# Patient Record
Sex: Female | Born: 1965
Health system: Southern US, Community
[De-identification: ages and names within clinical notes are randomized; demographics above are authoritative.]

## PROBLEM LIST (undated history)

## (undated) DIAGNOSIS — R112 Nausea with vomiting, unspecified: Secondary | ICD-10-CM

## (undated) DIAGNOSIS — M1990 Unspecified osteoarthritis, unspecified site: Secondary | ICD-10-CM

## (undated) DIAGNOSIS — K219 Gastro-esophageal reflux disease without esophagitis: Secondary | ICD-10-CM

## (undated) DIAGNOSIS — Z9889 Other specified postprocedural states: Secondary | ICD-10-CM

## (undated) DIAGNOSIS — N281 Cyst of kidney, acquired: Secondary | ICD-10-CM

## (undated) HISTORY — PX: ABDOMINAL HYSTERECTOMY: SHX81

## (undated) HISTORY — PX: BREAST SURGERY: SHX581

## (undated) HISTORY — DX: Cyst of kidney, acquired: N28.1

## (undated) HISTORY — PX: SPINAL FUSION: SHX223

---

## 1997-03-18 LAB — HM PAP SMEAR: HM Pap smear: NORMAL

## 2004-04-10 LAB — HM MAMMOGRAPHY: HM Mammogram: NORMAL

## 2006-10-13 ENCOUNTER — Emergency Department: Payer: Self-pay | Admitting: Internal Medicine

## 2009-10-08 HISTORY — PX: SPINE SURGERY: SHX786

## 2009-11-15 ENCOUNTER — Emergency Department: Payer: Self-pay | Admitting: Emergency Medicine

## 2009-12-27 ENCOUNTER — Ambulatory Visit: Payer: Self-pay | Admitting: Neurology

## 2010-01-12 ENCOUNTER — Ambulatory Visit: Payer: Self-pay | Admitting: Unknown Physician Specialty

## 2010-01-13 ENCOUNTER — Ambulatory Visit: Payer: Self-pay | Admitting: Unknown Physician Specialty

## 2010-01-17 ENCOUNTER — Inpatient Hospital Stay: Payer: Self-pay | Admitting: Unknown Physician Specialty

## 2010-01-26 ENCOUNTER — Encounter: Payer: Self-pay | Admitting: Unknown Physician Specialty

## 2010-02-05 ENCOUNTER — Encounter: Payer: Self-pay | Admitting: Unknown Physician Specialty

## 2010-03-08 ENCOUNTER — Encounter: Payer: Self-pay | Admitting: Unknown Physician Specialty

## 2011-07-06 ENCOUNTER — Ambulatory Visit: Payer: Self-pay | Admitting: General Practice

## 2011-07-26 ENCOUNTER — Encounter: Payer: Self-pay | Admitting: Internal Medicine

## 2011-07-26 ENCOUNTER — Ambulatory Visit (INDEPENDENT_AMBULATORY_CARE_PROVIDER_SITE_OTHER): Payer: PRIVATE HEALTH INSURANCE | Admitting: Internal Medicine

## 2011-07-26 DIAGNOSIS — Z0000 Encounter for general adult medical examination without abnormal findings: Secondary | ICD-10-CM

## 2011-07-26 DIAGNOSIS — R110 Nausea: Secondary | ICD-10-CM

## 2011-07-26 DIAGNOSIS — R109 Unspecified abdominal pain: Secondary | ICD-10-CM

## 2011-07-26 MED ORDER — OMEPRAZOLE 40 MG PO CPDR: 40.0000 mg | DELAYED_RELEASE_CAPSULE | Freq: Every day | ORAL | Status: DC

## 2011-07-26 MED ORDER — ONDANSETRON HCL 4 MG PO TABS: 4.0000 mg | ORAL_TABLET | Freq: Three times a day (TID) | ORAL | Status: AC | PRN

## 2011-07-26 NOTE — Progress Notes (Signed)
Subjective:    Patient ID: Heather Pope, female    DOB: 1966-04-12, 45 y.o.   MRN: [id number]  HPI Heather Pope is a 45 year old female who presents to establish care. Her primary concern today is abdominal pain. She reports that she first developed abdominal pain approximately one month ago. The pain is described as aching in her mid upper abdomen. It is worse at night. It is associated with a sensation of bloating or distention. She reports that she almost constantly feels nauseous. She denies any vomiting. She denies any change in her nausea with food intake. She denies any diarrhea. She denies any change in her bowel movements or blood in her stool. She was evaluated by a practitioner at [facility] and had a right upper quadrant ultrasound which was normal. She has been taking Zantac with minimal improvement. She denies any fever or chills. She denies any other symptoms.  Outpatient Encounter Prescriptions as of 07/26/2011  Medication Sig Dispense Refill  . acetaminophen (TYLENOL) 500 MG tablet Take 500 mg by mouth every 6 (six) hours as needed. 2 tablets three times daily       . ranitidine (ZANTAC) 150 MG capsule Take 150 mg by mouth 2 (two) times daily. 3 tablets daily        . omeprazole (PRILOSEC) 40 MG capsule Take 1 capsule (40 mg total) by mouth daily.  30 capsule  1  . ondansetron (ZOFRAN) 4 MG tablet Take 1 tablet (4 mg total) by mouth every 8 (eight) hours as needed for nausea.  20 tablet  0    Review of Systems  Constitutional: Negative for fever, chills, appetite change, fatigue and unexpected weight change.  HENT: Negative for ear pain, congestion, sore throat, trouble swallowing, neck pain, voice change and sinus pressure.   Eyes: Negative for visual disturbance.  Respiratory: Negative for cough, shortness of breath, wheezing and stridor.   Cardiovascular: Negative for chest pain, palpitations and leg swelling.  Gastrointestinal: Positive for nausea,  abdominal pain and abdominal distention. Negative for vomiting, diarrhea, constipation, blood in stool and anal bleeding.  Genitourinary: Negative for dysuria, urgency, frequency, hematuria, flank pain, vaginal bleeding, vaginal discharge, difficulty urinating and vaginal pain.  Musculoskeletal: Negative for myalgias, arthralgias and gait problem.  Skin: Negative for color change and rash.  Neurological: Negative for dizziness and headaches.  Hematological: Negative for adenopathy. Does not bruise/bleed easily.  Psychiatric/Behavioral: Negative for suicidal ideas, sleep disturbance and dysphoric mood. The patient is not nervous/anxious.    BP 121/85  Pulse 67  Temp(Src) 98.6 F (37 C) (Oral)  Resp 16  Ht 5\' 8"  (1.727 m)  Wt 175 lb (79.379 kg)  BMI 26.61 kg/m2  SpO2 97%     Objective:   Physical Exam  Constitutional: She is oriented to person, place, and time. She appears well-developed and well-nourished. No distress.  HENT:  Head: Normocephalic and atraumatic.  Right Ear: External ear normal.  Left Ear: External ear normal.  Nose: Nose normal.  Mouth/Throat: Oropharynx is clear and moist. No oropharyngeal exudate.  Eyes: Conjunctivae are normal. Pupils are equal, round, and reactive to light. Right eye exhibits no discharge. Left eye exhibits no discharge. No scleral icterus.  Neck: Normal range of motion. Neck supple. No tracheal deviation present. No thyromegaly present.  Cardiovascular: Normal rate, regular rhythm, normal heart sounds and intact distal pulses.  Exam reveals no gallop and no friction rub.   No murmur heard. Pulmonary/Chest: Effort normal and breath sounds normal. No respiratory  distress. She has no wheezes. She has no rales. She exhibits no tenderness.  Abdominal: Soft. Bowel sounds are normal. She exhibits no distension and no mass. There is no hepatosplenomegaly. There is tenderness in the right upper quadrant and epigastric area. There is no rebound, no  guarding and negative Murphy's sign. No hernia.  Musculoskeletal: Normal range of motion. She exhibits no edema and no tenderness.  Lymphadenopathy:    She has no cervical adenopathy.  Neurological: She is alert and oriented to person, place, and time. No cranial nerve deficit. She exhibits normal muscle tone. Coordination normal.  Skin: Skin is warm and dry. No rash noted. She is not diaphoretic. No erythema. No pallor.  Psychiatric: She has a normal mood and affect. Her behavior is normal. Judgment and thought content normal.          Assessment & Plan:  1. Abdominal pain - patient with epigastric pain. Right upper quadrant ultrasound which was performed was reviewed today. It showed normal-appearing gallbladder with no stones or sludging. It showed normal kidneys, spleen, liver. Her exam and symptoms are most concerning for gastritis or gastric ulcer. She has also had some recent anorexia and persistent nausea which is concerning for pancreatic abnormality. The right upper quadrant ultrasound which was performed did not completely visualize the pancreas. We will plan to get a CT of the abdomen and pelvis. We'll also send lab work including CMP and testing for H. pylori. We will start her on Prilosec. She will followup next week.

## 2011-07-27 ENCOUNTER — Ambulatory Visit: Payer: Self-pay | Admitting: Internal Medicine

## 2011-07-27 LAB — COMPREHENSIVE METABOLIC PANEL
ALT: 12 U/L (ref 0–35)
AST: 20 U/L (ref 0–37)
CO2: 27 mEq/L (ref 19–32)
GFR: 96.89 mL/min (ref >60.00)
Sodium: 138 mEq/L (ref 135–145)
Total Bilirubin: 0.5 mg/dL (ref 0.3–1.2)
Total Protein: 7.4 g/dL (ref 6.0–8.3)

## 2011-07-27 LAB — HELICOBACTER PYLORI  ANTIBODY, IGM: Helicobacter pylori, IgM: 2 U/mL (ref <9.0)

## 2011-07-30 ENCOUNTER — Telehealth: Payer: Self-pay | Admitting: Internal Medicine

## 2011-07-30 NOTE — Telephone Encounter (Signed)
OK. I would like to set her up with Dr. Skeet Simmer asap.  She needs endoscopy.  Can you set this up?

## 2011-07-30 NOTE — Telephone Encounter (Signed)
Informed patient that her abdominal CT was normal.  She states she is still hurting in her abdominal area and she is still nauseated.

## 2011-07-30 NOTE — Telephone Encounter (Signed)
Pt calling to get lab and ct results done last thur//friday am Please call [phone]

## 2011-07-30 NOTE — Telephone Encounter (Signed)
Message copied by Virgina Evener on Mon Jul 30, 2011  2:01 PM ------      Message from: Ronna Polio A      Created: Fri Jul 27, 2011  5:40 PM      Regarding: CT abd       CT abd was normal. Are symptoms better?

## 2011-07-31 ENCOUNTER — Telehealth: Payer: Self-pay | Admitting: *Deleted

## 2011-07-31 NOTE — Telephone Encounter (Signed)
Message copied by Jobie Quaker on Tue Jul 31, 2011  4:40 PM ------      Message from: Ronna Polio A      Created: Fri Jul 27, 2011  5:40 PM      Regarding: CT abd       CT abd was normal. Are symptoms better?

## 2011-08-01 NOTE — Telephone Encounter (Signed)
Left message advising patient that CT was normal And asked that she call office back if symptoms had not improved.

## 2011-08-02 ENCOUNTER — Ambulatory Visit: Payer: Self-pay | Admitting: Internal Medicine

## 2011-08-09 ENCOUNTER — Encounter: Payer: Self-pay | Admitting: Internal Medicine

## 2011-08-09 ENCOUNTER — Ambulatory Visit (INDEPENDENT_AMBULATORY_CARE_PROVIDER_SITE_OTHER): Payer: PRIVATE HEALTH INSURANCE | Admitting: Internal Medicine

## 2011-08-09 ENCOUNTER — Ambulatory Visit: Payer: Self-pay | Admitting: Internal Medicine

## 2011-08-09 DIAGNOSIS — R109 Unspecified abdominal pain: Secondary | ICD-10-CM | POA: Insufficient documentation

## 2011-08-09 DIAGNOSIS — R928 Other abnormal and inconclusive findings on diagnostic imaging of breast: Secondary | ICD-10-CM

## 2011-08-09 NOTE — Progress Notes (Signed)
Subjective:    Patient ID: Heather Pope, female    DOB: 1966/07/07, 45 y.o.   MRN: [id number]  HPI 45 year old female with a history of abdominal pain presents for followup. In the interim since her last visit, she underwent CT of the abdomen which was normal. She also underwent laboratory testing including testing for H. pylori which was negative. She was then evaluated by GI medicine and asked to switch from Prilosec to Protonix. She has not yet made this change. She reports persistent abdominal pain. She also notes persistent nausea and decreased appetite. She reports that her abdominal pain is most prominent in the epigastric area and right upper quadrant. She notes minimal improvement with the use of Prilosec. She has not had any change in her bowel or bladder habits. She denies any blood in her stool or black stool. She denies any vomiting. She notes that she is scheduled for upper endoscopy next week.  She also notes a recent history of an abnormal mammogram. She has been scheduled for additional views of her right breast. She denies any palpable abnormalities in this area or leakage from her breast or change in the overlying skin color or texture.  Outpatient Encounter Prescriptions as of 08/09/2011  Medication Sig Dispense Refill  . acetaminophen (TYLENOL) 500 MG tablet Take 500 mg by mouth every 6 (six) hours as needed. 2 tablets three times daily       . omeprazole (PRILOSEC) 40 MG capsule Take 1 capsule (40 mg total) by mouth daily.  30 capsule  1  . ranitidine (ZANTAC) 150 MG capsule Take 150 mg by mouth 2 (two) times daily. 3 tablets daily          Review of Systems  Constitutional: Negative for fever, chills, appetite change, fatigue and unexpected weight change.  HENT: Negative for congestion, sore throat, trouble swallowing, voice change and sinus pressure.   Eyes: Negative for visual disturbance.  Respiratory: Negative for cough, shortness of breath, wheezing and stridor.     Cardiovascular: Negative for chest pain, palpitations and leg swelling.  Gastrointestinal: Positive for nausea, abdominal pain and abdominal distention. Negative for vomiting, diarrhea, constipation, blood in stool and anal bleeding.  Genitourinary: Negative for dysuria and flank pain.  Musculoskeletal: Negative for myalgias, arthralgias and gait problem.  Skin: Negative for color change and rash.  Neurological: Negative for dizziness and headaches.  Hematological: Negative for adenopathy. Does not bruise/bleed easily.  Psychiatric/Behavioral: Negative for suicidal ideas, sleep disturbance and dysphoric mood. The patient is not nervous/anxious.    BP 121/83  Pulse 66  Temp(Src) 98.2 F (36.8 C) (Oral)  Resp 16  Ht 5\' 8"  (1.727 m)  Wt 177 lb (80.287 kg)  BMI 26.91 kg/m2  SpO2 99%     Objective:   Physical Exam  Constitutional: She is oriented to person, place, and time. She appears well-developed and well-nourished. No distress.  HENT:  Head: Normocephalic and atraumatic.  Right Ear: External ear normal.  Left Ear: External ear normal.  Nose: Nose normal.  Mouth/Throat: Oropharynx is clear and moist.  Eyes: Conjunctivae are normal. Pupils are equal, round, and reactive to light. Right eye exhibits no discharge. Left eye exhibits no discharge. No scleral icterus.  Neck: Normal range of motion. Neck supple. No tracheal deviation present. No thyromegaly present.  Cardiovascular: Normal rate, regular rhythm, normal heart sounds and intact distal pulses.  Exam reveals no gallop and no friction rub.   No murmur heard. Pulmonary/Chest: Effort normal and breath sounds normal.  No respiratory distress. She has no wheezes. She has no rales. She exhibits no tenderness.  Abdominal: Soft. Bowel sounds are normal. She exhibits no distension. There is tenderness (right upper quadrant).  Musculoskeletal: Normal range of motion. She exhibits no edema and no tenderness.  Lymphadenopathy:    She  has no cervical adenopathy.  Neurological: She is alert and oriented to person, place, and time. No cranial nerve deficit. She exhibits normal muscle tone. Coordination normal.  Skin: Skin is warm and dry. No rash noted. She is not diaphoretic. No erythema. No pallor.  Psychiatric: She has a normal mood and affect. Her behavior is normal. Judgment and thought content normal.          Assessment & Plan:  1. Right upper quadrant abdominal pain - CT scan was unremarkable. Symptoms seem most consistent with gastritis or ulcer. However, she has had no improvement with the use of proton pump inhibitors. Upper endoscopy is pending. We discussed the potential use of sucralfate to help with pain, however she would prefer to wait until after her endoscopy is complete. She will followup in one month.  2. Abnormal mammogram -patient is scheduled next week for additional views of her right breast. We will followup with her after the study is complete.

## 2011-08-10 ENCOUNTER — Telehealth: Payer: Self-pay | Admitting: *Deleted

## 2011-08-10 NOTE — Telephone Encounter (Signed)
This was the correct patient for the normal chest xray. Patient has been notified.

## 2011-08-15 ENCOUNTER — Ambulatory Visit: Payer: Self-pay | Admitting: Internal Medicine

## 2011-08-15 ENCOUNTER — Encounter: Payer: Self-pay | Admitting: Internal Medicine

## 2011-08-17 ENCOUNTER — Telehealth: Payer: Self-pay | Admitting: Internal Medicine

## 2011-08-17 NOTE — Telephone Encounter (Signed)
Additional views on mammogram were normal. Follow up 1 year.

## 2011-08-17 NOTE — Telephone Encounter (Signed)
Patient informed. 

## 2011-08-17 NOTE — Telephone Encounter (Signed)
Left mess to call office back.   

## 2011-08-27 ENCOUNTER — Encounter: Payer: Self-pay | Admitting: Internal Medicine

## 2011-08-29 ENCOUNTER — Encounter: Payer: Self-pay | Admitting: Internal Medicine

## 2011-09-05 ENCOUNTER — Encounter: Payer: Self-pay | Admitting: Internal Medicine

## 2012-01-03 ENCOUNTER — Encounter: Payer: Self-pay | Admitting: Internal Medicine

## 2012-01-03 ENCOUNTER — Ambulatory Visit (INDEPENDENT_AMBULATORY_CARE_PROVIDER_SITE_OTHER): Payer: PRIVATE HEALTH INSURANCE | Admitting: Internal Medicine

## 2012-01-03 VITALS — BP 118/78 | HR 95 | Temp 98.4°F | Wt 192.0 lb

## 2012-01-03 DIAGNOSIS — M6580 Other synovitis and tenosynovitis, unspecified site: Secondary | ICD-10-CM

## 2012-01-03 DIAGNOSIS — M76892 Other specified enthesopathies of left lower limb, excluding foot: Secondary | ICD-10-CM | POA: Insufficient documentation

## 2012-01-03 MED ORDER — MELOXICAM 15 MG PO TABS: 15.0000 mg | ORAL_TABLET | Freq: Every day | ORAL | Status: AC

## 2012-01-03 MED ORDER — TRAMADOL-ACETAMINOPHEN 37.5-325 MG PO TABS: 1.0000 | ORAL_TABLET | Freq: Four times a day (QID) | ORAL | Status: AC | PRN

## 2012-01-03 NOTE — Progress Notes (Signed)
Subjective:    Patient ID: Heather Pope, female    DOB: 1965-11-14, 46 y.o.   MRN: [id number]  HPI 46 year old female presents for an acute visit after developing pain and swelling over both of her knees approximately 2 days ago. She notes that she was treated recently for pneumonia with a seven-day course of Levaquin. On approximately day 5 of 7 days she noticed tingling and pain over her knees and ankles. On day 6 she noticed some swelling over her knees. She feels as if her left knee will give out on her. She notes significant pain in the back of her left knee. She finished Levaquin today. She has been using ibuprofen and Tylenol at home for pain with minimal improvement. The pain is most severe in her left knee but is present in both of her knees and minimally in her ankles. She has not had issues with tendinitis in the past. She does not have any known injury to her legs.  Outpatient Encounter Prescriptions as of 01/03/2012  Medication Sig Dispense Refill  . acetaminophen (TYLENOL) 500 MG tablet Take 500 mg by mouth every 6 (six) hours as needed. 2 tablets three times daily       . meloxicam (MOBIC) 15 MG tablet Take 1 tablet (15 mg total) by mouth daily.  30 tablet  0  . traMADol-acetaminophen (ULTRACET) 37.5-325 MG per tablet Take 1 tablet by mouth every 6 (six) hours as needed for pain.  30 tablet  0    Review of Systems  Constitutional: Negative for fever, chills and fatigue.  Respiratory: Positive for cough.   Cardiovascular: Positive for leg swelling. Negative for chest pain.  Musculoskeletal: Positive for myalgias, joint swelling, arthralgias and gait problem.   BP 118/78  Pulse 95  Temp(Src) 98.4 F (36.9 C) (Oral)  Wt 192 lb (87.091 kg)  SpO2 96%     Objective:   Physical Exam  Constitutional: She is oriented to person, place, and time. She appears well-developed and well-nourished. No distress.  HENT:  Head: Normocephalic and atraumatic.  Right Ear: External ear  normal.  Left Ear: External ear normal.  Nose: Nose normal.  Mouth/Throat: Oropharynx is clear and moist. No oropharyngeal exudate.  Eyes: Conjunctivae are normal. Pupils are equal, round, and reactive to light. Right eye exhibits no discharge. Left eye exhibits no discharge. No scleral icterus.  Neck: Normal range of motion. Neck supple. No tracheal deviation present. No thyromegaly present.  Cardiovascular: Normal rate, regular rhythm, normal heart sounds and intact distal pulses.  Exam reveals no gallop and no friction rub.   No murmur heard. Pulmonary/Chest: Effort normal and breath sounds normal. No respiratory distress. She has no wheezes. She has no rales. She exhibits no tenderness.  Musculoskeletal: She exhibits no edema and no tenderness.       Right knee: She exhibits decreased range of motion. She exhibits no swelling, no effusion and no erythema.       Left knee: She exhibits decreased range of motion and swelling. She exhibits no effusion, no ecchymosis, no erythema, normal alignment and normal patellar mobility. tenderness found.  Lymphadenopathy:    She has no cervical adenopathy.  Neurological: She is alert and oriented to person, place, and time. No cranial nerve deficit. She exhibits normal muscle tone. Coordination normal.  Skin: Skin is warm and dry. No rash noted. She is not diaphoretic. No erythema. No pallor.  Psychiatric: She has a normal mood and affect. Her behavior is normal. Judgment and thought  content normal.          Assessment & Plan:

## 2012-01-03 NOTE — Assessment & Plan Note (Signed)
Likely secondary to use of Levaquin. Will stop Levaquin.  Will use meloxicam for inflammation. Pt will apply ice to both knees. She will refrain from any strenuous activity. She will wear bilateral soft knee braces. If pain worsens, or swelling progresses, she will follow up this weekend at our urgent clinic or in ED.  Otherwise, she will return for recheck Monday. If no improvement, would favor MRI left knee.

## 2012-01-07 ENCOUNTER — Ambulatory Visit (INDEPENDENT_AMBULATORY_CARE_PROVIDER_SITE_OTHER): Payer: PRIVATE HEALTH INSURANCE | Admitting: Internal Medicine

## 2012-01-07 ENCOUNTER — Encounter: Payer: Self-pay | Admitting: Internal Medicine

## 2012-01-07 VITALS — BP 102/68 | HR 95 | Temp 97.7°F | Ht 68.0 in | Wt 191.0 lb

## 2012-01-07 DIAGNOSIS — Z0279 Encounter for issue of other medical certificate: Secondary | ICD-10-CM

## 2012-01-07 DIAGNOSIS — M25569 Pain in unspecified knee: Secondary | ICD-10-CM

## 2012-01-07 DIAGNOSIS — M25562 Pain in left knee: Secondary | ICD-10-CM

## 2012-01-07 NOTE — Assessment & Plan Note (Addendum)
Symptoms concerning for meniscal tear. Will get MRI knee and set up evaluation with orthopedics.

## 2012-01-07 NOTE — Progress Notes (Signed)
  Subjective:    Patient ID: Heather Pope, female    DOB: 1966/05/07, 46 y.o.   MRN: [id number]  HPI 46 year old female with recent history of severe left knee pain x 1 week after being treated for pneumonia with Levaquin. She has stopped Levaquin. Pain in her other joints including right knee and ankles has improved. However, she continues to have severe pain in her left knee some instability in the knee. She also notes some popping sounds when she was walking this weekend. She has been using a brace with minimal improvement. She has been taking nonsteroidals and tramadol with minimal improvement in pain.  Outpatient Encounter Prescriptions as of 01/07/2012  Medication Sig Dispense Refill  . acetaminophen (TYLENOL) 500 MG tablet Take 500 mg by mouth every 6 (six) hours as needed. 2 tablets three times daily       . meloxicam (MOBIC) 15 MG tablet Take 1 tablet (15 mg total) by mouth daily.  30 tablet  0  . traMADol-acetaminophen (ULTRACET) 37.5-325 MG per tablet Take 1 tablet by mouth every 6 (six) hours as needed for pain.  30 tablet  0    Review of Systems  Constitutional: Negative for fever, chills and fatigue.  Respiratory: Negative for cough and shortness of breath.   Musculoskeletal: Positive for myalgias, joint swelling, arthralgias and gait problem.       Objective:   Physical Exam  Constitutional: She is oriented to person, place, and time. She appears well-developed and well-nourished. No distress.  HENT:  Head: Normocephalic and atraumatic.  Right Ear: External ear normal.  Left Ear: External ear normal.  Nose: Nose normal.  Mouth/Throat: Oropharynx is clear and moist.  Eyes: Conjunctivae are normal. Pupils are equal, round, and reactive to light. Right eye exhibits no discharge. Left eye exhibits no discharge. No scleral icterus.  Neck: Normal range of motion. Neck supple. No tracheal deviation present. No thyromegaly present.  Pulmonary/Chest: Effort normal. She exhibits  no tenderness.  Musculoskeletal: She exhibits no edema and no tenderness.       Left knee: She exhibits swelling. She exhibits normal range of motion. tenderness found.  Lymphadenopathy:    She has no cervical adenopathy.  Neurological: She is alert and oriented to person, place, and time. No cranial nerve deficit. She exhibits normal muscle tone. Coordination normal.  Skin: Skin is warm and dry. No rash noted. She is not diaphoretic. No erythema. No pallor.  Psychiatric: She has a normal mood and affect. Her behavior is normal. Judgment and thought content normal.          Assessment & Plan:

## 2012-01-08 ENCOUNTER — Ambulatory Visit: Payer: Self-pay | Admitting: Internal Medicine

## 2012-01-09 ENCOUNTER — Telehealth: Payer: Self-pay | Admitting: Internal Medicine

## 2012-01-09 NOTE — Telephone Encounter (Signed)
MRI left knee shows possible lateral meniscal tear and osteoarthritis.  Please confirm that pt has follow up with Ortho (I think scheduled for Friday).  She should stay off knee until eval by ortho. We should fax a copy to Dr. Martha Clan.

## 2012-01-10 ENCOUNTER — Encounter: Payer: Self-pay | Admitting: Internal Medicine

## 2012-01-10 NOTE — Telephone Encounter (Signed)
Patient informed, She will keep apt Friday. Have you seen her FMLA paperwork?

## 2012-01-10 NOTE — Telephone Encounter (Signed)
I filled out her paperwork already. Should be on your desk

## 2012-01-11 NOTE — Telephone Encounter (Signed)
I have gone thru the paperwork and faxes and have not seen this, Carollee Herter - can you help?

## 2012-01-15 ENCOUNTER — Encounter: Payer: Self-pay | Admitting: Internal Medicine

## 2012-01-15 NOTE — Telephone Encounter (Signed)
I called patient to see if she needed a copy of the [facility] paperwork and also advised her that it had been faxed to [employer] on 01/08/12.  She states that she doesn't need a copy of the form.

## 2012-01-15 NOTE — Telephone Encounter (Signed)
Maralyn Sago I have not seen any FMLA paperwork on patient.  I did however go look on your desk and in the folder marked Batch scanning found the patient's FMLA paperwork. It is marked Faxed on 01/08/12. I have made copy to send to billing to bill for the form being filled out and prepared the original to send to Batch [employer].

## 2012-01-24 ENCOUNTER — Ambulatory Visit: Payer: Self-pay | Admitting: Orthopedic Surgery

## 2012-01-28 ENCOUNTER — Encounter: Payer: Self-pay | Admitting: Orthopedic Surgery

## 2012-02-06 ENCOUNTER — Encounter: Payer: Self-pay | Admitting: Orthopedic Surgery

## 2012-05-28 ENCOUNTER — Encounter: Payer: Self-pay | Admitting: Internal Medicine

## 2012-05-28 ENCOUNTER — Ambulatory Visit (INDEPENDENT_AMBULATORY_CARE_PROVIDER_SITE_OTHER): Payer: PRIVATE HEALTH INSURANCE | Admitting: Internal Medicine

## 2012-05-28 VITALS — BP 100/70 | HR 78 | Temp 98.8°F | Ht 68.0 in | Wt 193.2 lb

## 2012-05-28 DIAGNOSIS — S83289A Other tear of lateral meniscus, current injury, unspecified knee, initial encounter: Secondary | ICD-10-CM

## 2012-05-28 DIAGNOSIS — M549 Dorsalgia, unspecified: Secondary | ICD-10-CM

## 2012-05-28 DIAGNOSIS — R5383 Other fatigue: Secondary | ICD-10-CM | POA: Insufficient documentation

## 2012-05-28 DIAGNOSIS — R5381 Other malaise: Secondary | ICD-10-CM

## 2012-05-28 DIAGNOSIS — R519 Headache, unspecified: Secondary | ICD-10-CM | POA: Insufficient documentation

## 2012-05-28 DIAGNOSIS — M545 Low back pain, unspecified: Secondary | ICD-10-CM | POA: Insufficient documentation

## 2012-05-28 DIAGNOSIS — R51 Headache: Secondary | ICD-10-CM | POA: Insufficient documentation

## 2012-05-28 MED ORDER — CYCLOBENZAPRINE HCL 5 MG PO TABS: 5.0000 mg | ORAL_TABLET | Freq: Three times a day (TID) | ORAL | Status: AC | PRN

## 2012-05-28 NOTE — Progress Notes (Signed)
Subjective:    Patient ID: Heather Pope, female    DOB: September 18, 1966, 46 y.o.   MRN: [id number]  HPI 46YO female with history of left knee pain secondary to lateral meniscal tear presents for followup. She reports that she was seen by orthopedics in April 2013 and underwent physical therapy and steroid injection. She reports no improvement in her symptoms with this intervention. She continues to have left knee pain which is worse with on standing or prolonged use of her leg. She continues to use ibuprofen with minimal improvement.  She is also concerned today about two-week history of right temporal headache pain. The pain is described as severe and pulsating. She denies any trauma to her head. She denies any visual changes. She denies any nausea, photo or phonophobia. She denies any ear pain, nasal congestion, sore throat, or other symptoms. The headache pain does not resolve with use of Tylenol or Advil. Prior to this, she reports several month history of fatigue. She reports that even after a good nights sleep she has difficulty getting going during the day because of severe fatigue.  Outpatient Encounter Prescriptions as of 05/28/2012  Medication Sig Dispense Refill  . acetaminophen (TYLENOL) 500 MG tablet Take 500 mg by mouth every 6 (six) hours as needed. 2 tablets three times daily       . ibuprofen (ADVIL,MOTRIN) 200 MG tablet Take 200 mg by mouth 2 (two) times daily.      . cyclobenzaprine (FLEXERIL) 5 MG tablet Take 1 tablet (5 mg total) by mouth 3 (three) times daily as needed for muscle spasms.  90 tablet  1  . meloxicam (MOBIC) 15 MG tablet Take 1 tablet (15 mg total) by mouth daily.  30 tablet  0    Review of Systems  Constitutional: Negative for fever, chills, appetite change, fatigue and unexpected weight change.  HENT: Negative for hearing loss, ear pain, congestion, sore throat, trouble swallowing, neck pain, voice change and sinus pressure.   Eyes: Negative for photophobia,  pain and visual disturbance.  Respiratory: Negative for cough, shortness of breath, wheezing and stridor.   Cardiovascular: Negative for chest pain, palpitations and leg swelling.  Gastrointestinal: Negative for nausea, vomiting, abdominal pain, diarrhea, constipation, blood in stool, abdominal distention and anal bleeding.  Genitourinary: Negative for dysuria and flank pain.  Musculoskeletal: Positive for myalgias and arthralgias. Negative for gait problem.  Skin: Negative for color change and rash.  Neurological: Positive for headaches. Negative for dizziness.  Hematological: Negative for adenopathy. Does not bruise/bleed easily.  Psychiatric/Behavioral: Negative for suicidal ideas, disturbed wake/sleep cycle and dysphoric mood. The patient is not nervous/anxious.        Objective:   Physical Exam  Constitutional: She is oriented to person, place, and time. She appears well-developed and well-nourished. No distress.  HENT:  Head: Normocephalic and atraumatic.  Right Ear: Tympanic membrane, external ear and ear canal normal.  Left Ear: Tympanic membrane, external ear and ear canal normal.  Nose: Nose normal.  Mouth/Throat: Oropharynx is clear and moist. No oropharyngeal exudate.  Eyes: Conjunctivae are normal. Pupils are equal, round, and reactive to light. Right eye exhibits no discharge. Left eye exhibits no discharge. No scleral icterus.  Neck: Normal range of motion. Neck supple. No tracheal deviation present. No thyromegaly present.  Cardiovascular: Normal rate, regular rhythm, normal heart sounds and intact distal pulses.  Exam reveals no gallop and no friction rub.   No murmur heard. Pulmonary/Chest: Effort normal and breath sounds normal. No respiratory distress.  She has no wheezes. She has no rales. She exhibits no tenderness.  Musculoskeletal: Normal range of motion. She exhibits no edema and no tenderness.       Left knee: She exhibits normal range of motion and no swelling.    Lymphadenopathy:    She has no cervical adenopathy.  Neurological: She is alert and oriented to person, place, and time. No cranial nerve deficit. She exhibits normal muscle tone. Coordination normal.  Skin: Skin is warm and dry. No rash noted. She is not diaphoretic. No erythema. No pallor.  Psychiatric: She has a normal mood and affect. Her behavior is normal. Judgment and thought content normal.          Assessment & Plan:

## 2012-05-28 NOTE — Assessment & Plan Note (Signed)
Back pain, chronic secondary to muscular spasm. Alleviated with Flexeril. Will continue.

## 2012-05-28 NOTE — Assessment & Plan Note (Signed)
Patient with two-week history of right temporal headache. This was preceded by several weeks of fatigue. Question if she may have polymyalgia rheumatica and temporal arteritis. Will check ESR with labs today. If elevated, will plan to refer for temporal artery biopsy.

## 2012-05-28 NOTE — Assessment & Plan Note (Signed)
Question if this may be related to polymyalgia rheumatica. Will check ESR with labs today. We'll also check thyroid function, CBC, CMP.

## 2012-05-28 NOTE — Assessment & Plan Note (Signed)
Patient with left knee pain secondary to lateral meniscal tear. Was seen by local orthopedic physician and underwent physical therapy with no improvement. Would like a second opinion. Will set up [facility] orthopedics.

## 2012-05-29 LAB — C-REACTIVE PROTEIN: CRP: 0.5 mg/dL (ref 0.5–20.0)

## 2012-05-29 LAB — CBC WITH DIFFERENTIAL/PLATELET
Basophils Relative: 0.6 % (ref 0.0–3.0)
Eosinophils Relative: 2.2 % (ref 0.0–5.0)
HCT: 40.4 % (ref 36.0–46.0)
Hemoglobin: 13.4 g/dL (ref 12.0–15.0)
Lymphs Abs: 2.5 10*3/uL (ref 0.7–4.0)
MCV: 88.6 fl (ref 78.0–100.0)
Monocytes Relative: 4.7 % (ref 3.0–12.0)
Neutro Abs: 5.4 10*3/uL (ref 1.4–7.7)
Platelets: 261 10*3/uL (ref 150.0–400.0)
RBC: 4.56 Mil/uL (ref 3.87–5.11)
WBC: 8.6 10*3/uL (ref 4.5–10.5)

## 2012-05-29 LAB — COMPREHENSIVE METABOLIC PANEL
Alkaline Phosphatase: 65 U/L (ref 39–117)
CO2: 28 mEq/L (ref 19–32)
Creatinine, Ser: 0.9 mg/dL (ref 0.4–1.2)
GFR: 75.89 mL/min (ref >60.00)
Glucose, Bld: 78 mg/dL (ref 70–99)
Total Bilirubin: 0.4 mg/dL (ref 0.3–1.2)

## 2012-05-29 LAB — TSH: TSH: 2.01 u[IU]/mL (ref 0.35–5.50)

## 2012-05-29 LAB — SEDIMENTATION RATE: Sed Rate: 15 mm/hr (ref 0–22)

## 2012-05-30 ENCOUNTER — Telehealth: Payer: Self-pay | Admitting: Internal Medicine

## 2012-05-30 DIAGNOSIS — R51 Headache: Secondary | ICD-10-CM

## 2012-05-30 NOTE — Telephone Encounter (Signed)
Patient wants results from her labs.

## 2012-05-30 NOTE — Telephone Encounter (Signed)
Patient advised of lab results, she stated that she would like to have MRI because she continues to have headaches and she just doesn't feel right.

## 2012-05-30 NOTE — Telephone Encounter (Signed)
I will ask Erie Noe to help schedule this.

## 2012-05-30 NOTE — Telephone Encounter (Signed)
Can you please set up MRI brain to evaluate right temporal headache? I will put order in.

## 2012-06-03 ENCOUNTER — Emergency Department: Payer: Self-pay | Admitting: Emergency Medicine

## 2012-06-03 LAB — COMPREHENSIVE METABOLIC PANEL
Anion Gap: 6 — ABNORMAL LOW (ref 7–16)
Chloride: 107 mmol/L (ref 98–107)
Co2: 26 mmol/L (ref 21–32)
Creatinine: 0.82 mg/dL (ref 0.60–1.30)
EGFR (African American): >60
EGFR (Non-African Amer.): >60
Osmolality: 276 (ref 275–301)
SGOT(AST): 23 U/L (ref 15–37)
SGPT (ALT): 15 U/L (ref 12–78)
Sodium: 139 mmol/L (ref 136–145)
Total Protein: 7.5 g/dL (ref 6.4–8.2)

## 2012-06-03 LAB — CBC
HCT: 40.1 % (ref 35.0–47.0)
HGB: 13.5 g/dL (ref 12.0–16.0)
MCH: 29 pg (ref 26.0–34.0)
MCHC: 33.6 g/dL (ref 32.0–36.0)
RDW: 13.1 % (ref 11.5–14.5)
WBC: 7.9 10*3/uL (ref 3.6–11.0)

## 2012-06-11 ENCOUNTER — Ambulatory Visit: Payer: Self-pay | Admitting: Internal Medicine

## 2012-06-12 ENCOUNTER — Telehealth: Payer: Self-pay | Admitting: Internal Medicine

## 2012-06-12 NOTE — Telephone Encounter (Signed)
MRI of the brain was normal except for possible inflammation within the left ethmoid sinus(consistent with acute or chronic sinusitis). If symptoms of headache are persistent, we may want to consider referral to headache specialist. Please let us know how you are doing.

## 2012-06-19 ENCOUNTER — Encounter: Payer: Self-pay | Admitting: Internal Medicine

## 2012-08-25 ENCOUNTER — Ambulatory Visit (INDEPENDENT_AMBULATORY_CARE_PROVIDER_SITE_OTHER): Payer: PRIVATE HEALTH INSURANCE

## 2012-08-25 DIAGNOSIS — Z23 Encounter for immunization: Secondary | ICD-10-CM

## 2012-11-23 ENCOUNTER — Other Ambulatory Visit: Payer: Self-pay

## 2013-03-10 ENCOUNTER — Ambulatory Visit: Payer: Self-pay | Admitting: Internal Medicine

## 2013-03-13 ENCOUNTER — Ambulatory Visit: Payer: PRIVATE HEALTH INSURANCE | Admitting: Adult Health

## 2013-03-19 ENCOUNTER — Ambulatory Visit (INDEPENDENT_AMBULATORY_CARE_PROVIDER_SITE_OTHER): Payer: 59 | Admitting: Internal Medicine

## 2013-03-19 ENCOUNTER — Encounter: Payer: Self-pay | Admitting: Internal Medicine

## 2013-03-19 VITALS — BP 106/72 | HR 91 | Temp 98.3°F | Resp 12 | Wt 188.0 lb

## 2013-03-19 DIAGNOSIS — R079 Chest pain, unspecified: Secondary | ICD-10-CM

## 2013-03-19 DIAGNOSIS — M549 Dorsalgia, unspecified: Secondary | ICD-10-CM

## 2013-03-19 DIAGNOSIS — R002 Palpitations: Secondary | ICD-10-CM

## 2013-03-19 NOTE — Assessment & Plan Note (Signed)
Intermittent palpitations and chest pain concerning for CAD. EKG normal today.Will check CMP, CBC, TSH. Will set up cardiology evaluation for possible stress test and Holter.

## 2013-03-19 NOTE — Assessment & Plan Note (Signed)
Left sided chest pain with exercise concerning for CAD. Risk factors including smoking and family history. Will check CMP, CBC, TSH, lipids. EKG normal today. Will set up cardiology evaluation for possible stress test and Holter.

## 2013-03-19 NOTE — Progress Notes (Signed)
Subjective:    Patient ID: Heather Pope, female    DOB: 25-Jul-1966, 47 y.o.   MRN: [id number]  HPI 47 year old female with history of tobacco abuse presents for acute visit complaining of chest pain and palpitations. She reports that she was trying to adopt a healthier lifestyle, cutting back on cigarette use and exercising. About 1-1/2 months ago she was exercising at the gym when she suddenly developed left-sided chest pain that radiated across the left side of her chest to her left arm. During that time, she reports she was diaphoretic and short of breath. She stopped exercising and rested for several minutes with resolution of her symptoms. However, after this event she was too afraid to resume any exercise program. Intermittently, since that event, she has had episodes of palpitations described as fluttering. These are not generally associated with chest pain or diaphoresis. They're described as irregular heartbeats. She has no prior history of coronary artery disease. She is a smoker. She reports that her mother and her mother's family have a strong family history of heart disease.  She also notes intermittent episodes of hot flashes which occur throughout the day and night. She is not sure if this is related to chest pain or palpitations. She questions whether she may be going through menopause. She denies any shortness of breath, change in appetite, change in weight.  Outpatient Encounter Prescriptions as of 03/19/2013  Medication Sig Dispense Refill  . acetaminophen (TYLENOL) 500 MG tablet Take 500 mg by mouth every 6 (six) hours as needed. 2 tablets three times daily       . [DISCONTINUED] ibuprofen (ADVIL,MOTRIN) 200 MG tablet Take 200 mg by mouth 2 (two) times daily.       No facility-administered encounter medications on file as of 03/19/2013.   BP 106/72  Pulse 91  Temp(Src) 98.3 F (36.8 C) (Oral)  Resp 12  Wt 188 lb (85.276 kg)  BMI 28.59 kg/m2  SpO2 98%  Review of Systems   Constitutional: Positive for diaphoresis. Negative for fever, chills, appetite change, fatigue and unexpected weight change.  HENT: Negative for ear pain, congestion, sore throat, trouble swallowing, neck pain, voice change and sinus pressure.   Eyes: Negative for visual disturbance.  Respiratory: Positive for shortness of breath. Negative for cough, wheezing and stridor.   Cardiovascular: Positive for chest pain and palpitations. Negative for leg swelling.  Gastrointestinal: Negative for nausea, vomiting, abdominal pain, diarrhea, constipation, blood in stool, abdominal distention and anal bleeding.  Genitourinary: Negative for dysuria and flank pain.  Musculoskeletal: Negative for myalgias, arthralgias and gait problem.  Skin: Negative for color change and rash.  Neurological: Negative for dizziness and headaches.  Hematological: Negative for adenopathy. Does not bruise/bleed easily.  Psychiatric/Behavioral: Negative for suicidal ideas, sleep disturbance and dysphoric mood. The patient is not nervous/anxious.        Objective:   Physical Exam  Constitutional: She is oriented to person, place, and time. She appears well-developed and well-nourished. No distress.  HENT:  Head: Normocephalic and atraumatic.  Right Ear: External ear normal.  Left Ear: External ear normal.  Nose: Nose normal.  Mouth/Throat: Oropharynx is clear and moist. No oropharyngeal exudate.  Eyes: Conjunctivae are normal. Pupils are equal, round, and reactive to light. Right eye exhibits no discharge. Left eye exhibits no discharge. No scleral icterus.  Neck: Normal range of motion. Neck supple. No tracheal deviation present. No thyromegaly present.  Cardiovascular: Normal rate, regular rhythm, normal heart sounds and intact distal pulses.  Exam  reveals no gallop and no friction rub.   No murmur heard. Pulmonary/Chest: Effort normal and breath sounds normal. No accessory muscle usage. Not tachypneic. No respiratory  distress. She has no decreased breath sounds. She has no wheezes. She has no rhonchi. She has no rales. She exhibits no tenderness.  Musculoskeletal: Normal range of motion. She exhibits no edema and no tenderness.  Lymphadenopathy:    She has no cervical adenopathy.  Neurological: She is alert and oriented to person, place, and time. No cranial nerve deficit. She exhibits normal muscle tone. Coordination normal.  Skin: Skin is warm and dry. No rash noted. She is not diaphoretic. No erythema. No pallor.  Psychiatric: She has a normal mood and affect. Her behavior is normal. Judgment and thought content normal.          Assessment & Plan:

## 2013-03-20 LAB — COMPREHENSIVE METABOLIC PANEL
ALT: 14 U/L (ref 0–35)
AST: 16 U/L (ref 0–37)
Albumin: 4.2 g/dL (ref 3.5–5.2)
Calcium: 9.7 mg/dL (ref 8.4–10.5)
Chloride: 102 mEq/L (ref 96–112)
Creatinine, Ser: 0.8 mg/dL (ref 0.4–1.2)
Potassium: 3.8 mEq/L (ref 3.5–5.1)
Sodium: 137 mEq/L (ref 135–145)
Total Protein: 6.6 g/dL (ref 6.0–8.3)

## 2013-03-20 LAB — LIPID PANEL
LDL Cholesterol: 91 mg/dL (ref 0–99)
Total CHOL/HDL Ratio: 3

## 2013-03-20 LAB — CBC WITH DIFFERENTIAL/PLATELET
Basophils Relative: 1.3 % (ref 0.0–3.0)
Eosinophils Absolute: 0.2 10*3/uL (ref 0.0–0.7)
MCHC: 33.6 g/dL (ref 30.0–36.0)
MCV: 88.8 fl (ref 78.0–100.0)
Monocytes Absolute: 0.4 10*3/uL (ref 0.1–1.0)
Neutrophils Relative %: 61.2 % (ref 43.0–77.0)
RBC: 4.85 Mil/uL (ref 3.87–5.11)
RDW: 13.5 % (ref 11.5–14.6)

## 2013-03-24 ENCOUNTER — Encounter: Payer: Self-pay | Admitting: Cardiovascular Disease

## 2013-03-24 ENCOUNTER — Ambulatory Visit (INDEPENDENT_AMBULATORY_CARE_PROVIDER_SITE_OTHER): Payer: 59 | Admitting: Cardiovascular Disease

## 2013-03-24 VITALS — BP 118/94 | HR 75 | Ht 68.0 in | Wt 187.8 lb

## 2013-03-24 DIAGNOSIS — R079 Chest pain, unspecified: Secondary | ICD-10-CM

## 2013-03-24 DIAGNOSIS — I4902 Ventricular flutter: Secondary | ICD-10-CM

## 2013-03-24 DIAGNOSIS — R002 Palpitations: Secondary | ICD-10-CM

## 2013-03-24 DIAGNOSIS — F17200 Nicotine dependence, unspecified, uncomplicated: Secondary | ICD-10-CM

## 2013-03-24 DIAGNOSIS — I498 Other specified cardiac arrhythmias: Secondary | ICD-10-CM

## 2013-03-24 MED ORDER — PROPRANOLOL HCL 10 MG PO TABS: 10.0000 mg | ORAL_TABLET | Freq: Three times a day (TID) | ORAL | Status: DC | PRN

## 2013-03-24 NOTE — Assessment & Plan Note (Signed)
Etiology of her chest pain 6 weeks ago while on the treadmill is uncertain. She does have family history of CAD, she is a long-time smoker. We will order a stress test for her. Echo stress test will be ordered.

## 2013-03-24 NOTE — Assessment & Plan Note (Signed)
Episodes of palpitations and fast heart beats. Etiology not clear but they are very short lived. We did discuss wearing a Holter monitor. She would like to hold off for now as her symptoms are rare. I suggested if symptoms get worse that she call our office for further evaluation/Holter. We have given her propranolol to take as needed for tachycardia or palpitations.

## 2013-03-24 NOTE — Assessment & Plan Note (Signed)
We have encouraged her to continue to work on weaning her cigarettes and smoking cessation. She will continue to work on this and does not want any assistance with chantix.  

## 2013-03-24 NOTE — Patient Instructions (Addendum)
You are doing well. No medication changes were made.  We have ordered a stress echo for chest pain Take propranolol as needed for palpitations  Please call us if you have new issues that need to be addressed before your next appt.

## 2013-03-24 NOTE — Progress Notes (Signed)
   Patient ID: Heather Pope, female    DOB: 27-Mar-1966, 47 y.o.   MRN: [id number]  HPI Comments: Heather Pope is a very pleasant 47 year old woman with long history of smoking, family history of heart disease who works as a Heather Pope who presents by referral from Heather Pope for symptoms of chest pain, tachycardia.  She states that several months ago she started exercising to lose weight. She has been trying to quit smoking. She is concerned as the last time she quit smoking, she gained 64 pounds. 6 weeks ago, she was on the treadmill and after 10 minutes developed squeezing chest pain, diaphoresis/sweating. She is also had episodes of rapid heartbeats presenting at rest, sometimes with stress. Symptoms, at nighttime, sometimes will wake her up. Palpitations last for several seconds at a time. She's not sleeping well, reports having hot flashes and needed to go to the bathrooms. She attributes much of her symptoms to menopausal symptoms. She's waking up 4-6 times per night.  She's not been exercising since 6 weeks ago when she had chest pain. She has been ambulating with no symptoms of shortness of breath or chest pain. She is trying to quit smoking, now down to 8 cigarettes per day. She did try electronic cigarette and this caused a headache.  EKG shows normal sinus rhythm with rate 75 beats per minute with no significant ST or T wave changes   Outpatient Encounter Prescriptions as of 03/24/2013  Medication Sig Dispense Refill  . acetaminophen (TYLENOL) 500 MG tablet Take 500 mg by mouth every 6 (six) hours as needed. 2 tablets three times daily         Review of Systems  Constitutional: Negative.   HENT: Negative.   Eyes: Negative.   Respiratory: Positive for chest tightness.   Cardiovascular: Positive for chest pain and palpitations.  Gastrointestinal: Negative.   Musculoskeletal: Negative.   Skin: Negative.   Neurological: Negative.   Psychiatric/Behavioral: Negative.   All  other systems reviewed and are negative.    BP 118/94  Pulse 75  Ht 5\' 8"  (1.727 m)  Wt 187 lb 12 oz (85.163 kg)  BMI 28.55 kg/m2  Physical Exam  Nursing note and vitals reviewed. Constitutional: She is oriented to person, place, and time. She appears well-developed and well-nourished.  HENT:  Head: Normocephalic.  Nose: Nose normal.  Mouth/Throat: Oropharynx is clear and moist.  Eyes: Conjunctivae are normal. Pupils are equal, round, and reactive to light.  Neck: Normal range of motion. Neck supple. No JVD present.  Cardiovascular: Normal rate, regular rhythm, S1 normal, S2 normal, normal heart sounds and intact distal pulses.  Exam reveals no gallop and no friction rub.   No murmur heard. Pulmonary/Chest: Effort normal and breath sounds normal. No respiratory distress. She has no wheezes. She has no rales. She exhibits no tenderness.  Abdominal: Soft. Bowel sounds are normal. She exhibits no distension. There is no tenderness.  Musculoskeletal: Normal range of motion. She exhibits no edema and no tenderness.  Lymphadenopathy:    She has no cervical adenopathy.  Neurological: She is alert and oriented to person, place, and time. Coordination normal.  Skin: Skin is warm and dry. No rash noted. No erythema.  Psychiatric: She has a normal mood and affect. Her behavior is normal. Judgment and thought content normal.    Assessment and Plan

## 2013-04-21 ENCOUNTER — Encounter: Payer: Self-pay | Admitting: Internal Medicine

## 2013-05-07 ENCOUNTER — Other Ambulatory Visit: Payer: 59

## 2013-05-13 ENCOUNTER — Encounter: Payer: Self-pay | Admitting: Internal Medicine

## 2013-06-01 ENCOUNTER — Other Ambulatory Visit: Payer: Self-pay | Admitting: Internal Medicine

## 2013-06-01 NOTE — Telephone Encounter (Signed)
Rx faxed to pharmacy  

## 2013-06-01 NOTE — Telephone Encounter (Signed)
Last visit 03/19/13

## 2013-06-18 ENCOUNTER — Other Ambulatory Visit: Payer: 59

## 2013-07-02 ENCOUNTER — Other Ambulatory Visit: Payer: 59

## 2013-07-14 ENCOUNTER — Other Ambulatory Visit: Payer: 59

## 2013-07-29 ENCOUNTER — Ambulatory Visit (INDEPENDENT_AMBULATORY_CARE_PROVIDER_SITE_OTHER): Payer: 59

## 2013-07-29 DIAGNOSIS — Z23 Encounter for immunization: Secondary | ICD-10-CM

## 2013-07-30 ENCOUNTER — Telehealth: Payer: Self-pay

## 2013-07-30 NOTE — Telephone Encounter (Signed)
Spoke w/ pt regarding stress echo tomorrow.  Pt states that her propanolol made her feel bad, so she no longer takes that. I instructed her to make sure that she did not take that before her test. Pt verbalizes understanding of instructions.

## 2013-07-31 ENCOUNTER — Other Ambulatory Visit (INDEPENDENT_AMBULATORY_CARE_PROVIDER_SITE_OTHER): Payer: 59

## 2013-07-31 DIAGNOSIS — R079 Chest pain, unspecified: Secondary | ICD-10-CM

## 2013-07-31 DIAGNOSIS — R0609 Other forms of dyspnea: Secondary | ICD-10-CM

## 2013-07-31 DIAGNOSIS — I498 Other specified cardiac arrhythmias: Secondary | ICD-10-CM

## 2013-08-10 ENCOUNTER — Telehealth: Payer: Self-pay

## 2013-08-10 NOTE — Telephone Encounter (Signed)
Pt would like echo stress results.

## 2013-08-10 NOTE — Telephone Encounter (Signed)
Okay to look under results and give to patient  Normal study with no indication of ischemia Good heart function

## 2013-08-11 NOTE — Telephone Encounter (Signed)
Spoke w/ pt.  She is aware of results.  

## 2013-10-25 ENCOUNTER — Ambulatory Visit: Payer: Self-pay | Admitting: Physician Assistant

## 2014-03-31 ENCOUNTER — Encounter: Payer: Self-pay | Admitting: *Deleted

## 2014-03-31 ENCOUNTER — Ambulatory Visit: Payer: Self-pay | Admitting: Internal Medicine

## 2014-03-31 LAB — HM MAMMOGRAPHY: HM MAMMO: NEGATIVE

## 2014-04-15 ENCOUNTER — Encounter: Payer: Self-pay | Admitting: Internal Medicine

## 2014-06-14 ENCOUNTER — Ambulatory Visit: Payer: Self-pay | Admitting: Physician Assistant

## 2014-07-05 ENCOUNTER — Encounter: Payer: Self-pay | Admitting: Internal Medicine

## 2014-07-05 ENCOUNTER — Ambulatory Visit (INDEPENDENT_AMBULATORY_CARE_PROVIDER_SITE_OTHER): Payer: 59 | Admitting: Internal Medicine

## 2014-07-05 VITALS — BP 110/70 | HR 78 | Temp 98.1°F | Ht 68.0 in | Wt 186.8 lb

## 2014-07-05 DIAGNOSIS — F411 Generalized anxiety disorder: Secondary | ICD-10-CM

## 2014-07-05 DIAGNOSIS — Z0000 Encounter for general adult medical examination without abnormal findings: Secondary | ICD-10-CM

## 2014-07-05 DIAGNOSIS — J320 Chronic maxillary sinusitis: Secondary | ICD-10-CM

## 2014-07-05 DIAGNOSIS — F419 Anxiety disorder, unspecified: Secondary | ICD-10-CM | POA: Insufficient documentation

## 2014-07-05 LAB — COMPREHENSIVE METABOLIC PANEL
ALT: 11 U/L (ref 0–35)
AST: 15 U/L (ref 0–37)
Albumin: 4.2 g/dL (ref 3.5–5.2)
Alkaline Phosphatase: 59 U/L (ref 39–117)
BUN: 11 mg/dL (ref 6–23)
CALCIUM: 9.2 mg/dL (ref 8.4–10.5)
CHLORIDE: 103 meq/L (ref 96–112)
CO2: 24 meq/L (ref 19–32)
Creatinine, Ser: 0.7 mg/dL (ref 0.4–1.2)
GFR: 89.68 mL/min (ref >60.00)
Glucose, Bld: 94 mg/dL (ref 70–99)
Potassium: 4.5 mEq/L (ref 3.5–5.1)
SODIUM: 138 meq/L (ref 135–145)
TOTAL PROTEIN: 7.1 g/dL (ref 6.0–8.3)
Total Bilirubin: 0.5 mg/dL (ref 0.2–1.2)

## 2014-07-05 LAB — LIPID PANEL
Cholesterol: 175 mg/dL (ref 0–200)
HDL: 65.8 mg/dL (ref >39.00)
LDL Cholesterol: 99 mg/dL (ref 0–99)
NONHDL: 109.2
Total CHOL/HDL Ratio: 3
Triglycerides: 51 mg/dL (ref 0.0–149.0)
VLDL: 10.2 mg/dL (ref 0.0–40.0)

## 2014-07-05 LAB — MICROALBUMIN / CREATININE URINE RATIO
Creatinine,U: 132 mg/dL
MICROALB UR: 0.2 mg/dL (ref 0.0–1.9)
Microalb Creat Ratio: 0.2 mg/g (ref 0.0–30.0)

## 2014-07-05 LAB — CBC WITH DIFFERENTIAL/PLATELET
BASOS ABS: 0 10*3/uL (ref 0.0–0.1)
Basophils Relative: 0.5 % (ref 0.0–3.0)
Eosinophils Absolute: 0.3 10*3/uL (ref 0.0–0.7)
Eosinophils Relative: 4.5 % (ref 0.0–5.0)
HCT: 43 % (ref 36.0–46.0)
Hemoglobin: 14.3 g/dL (ref 12.0–15.0)
LYMPHS PCT: 34.8 % (ref 12.0–46.0)
Lymphs Abs: 2.2 10*3/uL (ref 0.7–4.0)
MCHC: 33.3 g/dL (ref 30.0–36.0)
MCV: 89.5 fl (ref 78.0–100.0)
MONOS PCT: 5.9 % (ref 3.0–12.0)
Monocytes Absolute: 0.4 10*3/uL (ref 0.1–1.0)
Neutro Abs: 3.5 10*3/uL (ref 1.4–7.7)
Neutrophils Relative %: 54.3 % (ref 43.0–77.0)
PLATELETS: 250 10*3/uL (ref 150.0–400.0)
RBC: 4.81 Mil/uL (ref 3.87–5.11)
RDW: 13.5 % (ref 11.5–15.5)
WBC: 6.4 10*3/uL (ref 4.0–10.5)

## 2014-07-05 LAB — VITAMIN D 25 HYDROXY (VIT D DEFICIENCY, FRACTURES): VITD: 19.6 ng/mL — ABNORMAL LOW (ref 30.00–100.00)

## 2014-07-05 LAB — TSH: TSH: 3.03 u[IU]/mL (ref 0.35–4.50)

## 2014-07-05 MED ORDER — DOXYCYCLINE HYCLATE 100 MG PO TABS: 100.0000 mg | ORAL_TABLET | Freq: Two times a day (BID) | ORAL | Status: DC

## 2014-07-05 MED ORDER — ALPRAZOLAM 0.25 MG PO TABS: 0.2500 mg | ORAL_TABLET | Freq: Three times a day (TID) | ORAL | Status: DC | PRN

## 2014-07-05 NOTE — Assessment & Plan Note (Signed)
Symptoms of anxiety and panic attacks associated with hot flashes. Will start prn Alprazolam, given symptoms are so infrequent.  Discussed potential risks of this medication including sedation.

## 2014-07-05 NOTE — Patient Instructions (Addendum)
Start Doxycycline 100mg  twice daily.  Start Alprazolam 0.25mg  up to three times daily as needed for panic attack.  Follow up in 4weeks.

## 2014-07-05 NOTE — Progress Notes (Signed)
Pre visit review using our clinic review tool, if applicable. No additional management support is needed unless otherwise documented below in the visit note. 

## 2014-07-05 NOTE — Assessment & Plan Note (Signed)
Symptoms and exam c/w right maxillary sinusitis. Will start Doxycycline bid. Continue prn antihistamines and ibuprofen.

## 2014-07-05 NOTE — Progress Notes (Signed)
Subjective:    Patient ID: Heather Pope, female    DOB: 05-08-1966, 48 y.o.   MRN: [id number]  HPI 48YO female presents for acute visit.  Treated at urgent care over Labor Day with z-pack for sinusitis. Continues to have right ear pain and right facial pressure. Clear nasal drainage. Occasional nausea. No fever, chills.  Menopause - hot flashes have improved, but occasional severe hot flashes with anxiety. Occurs about 1x per week. Palpitations during these episodes.  Review of Systems  Constitutional: Positive for diaphoresis. Negative for fever, chills, appetite change, fatigue and unexpected weight change.  HENT: Positive for congestion, ear pain, postnasal drip and sinus pressure. Negative for ear discharge, facial swelling, hearing loss, mouth sores, nosebleeds, rhinorrhea, sneezing, sore throat, tinnitus, trouble swallowing and voice change.   Eyes: Negative for pain, discharge, redness and visual disturbance.  Respiratory: Positive for cough. Negative for chest tightness, shortness of breath, wheezing and stridor.   Cardiovascular: Negative for chest pain, palpitations and leg swelling.  Gastrointestinal: Negative for abdominal pain.  Endocrine: Positive for heat intolerance.  Musculoskeletal: Negative for arthralgias, myalgias, neck pain and neck stiffness.  Skin: Negative for color change and rash.  Neurological: Negative for dizziness, weakness, light-headedness and headaches.  Hematological: Negative for adenopathy. Does not bruise/bleed easily.  Psychiatric/Behavioral: Negative for dysphoric mood. The patient is nervous/anxious.        Objective:    BP 110/70  Pulse 78  Temp(Src) 98.1 F (36.7 C) (Oral)  Ht 5\' 8"  (1.727 m)  Wt 186 lb 12 oz (84.709 kg)  BMI 28.40 kg/m2  SpO2 98% Physical Exam  Constitutional: She is oriented to person, place, and time. She appears well-developed and well-nourished. No distress.  HENT:  Head: Normocephalic and  atraumatic.  Right Ear: External ear normal.  Left Ear: External ear normal.  Nose: Nose normal.  Mouth/Throat: Oropharynx is clear and moist. No oropharyngeal exudate.  Eyes: Conjunctivae are normal. Pupils are equal, round, and reactive to light. Right eye exhibits no discharge. Left eye exhibits no discharge. No scleral icterus.  Neck: Normal range of motion. Neck supple. No tracheal deviation present. No thyromegaly present.  Cardiovascular: Normal rate, regular rhythm, normal heart sounds and intact distal pulses.  Exam reveals no gallop and no friction rub.   No murmur heard. Pulmonary/Chest: Effort normal and breath sounds normal. No accessory muscle usage. Not tachypneic. No respiratory distress. She has no decreased breath sounds. She has no wheezes. She has no rhonchi. She has no rales. She exhibits no tenderness.  Musculoskeletal: Normal range of motion. She exhibits no edema and no tenderness.  Lymphadenopathy:    She has no cervical adenopathy.  Neurological: She is alert and oriented to person, place, and time. No cranial nerve deficit. She exhibits normal muscle tone. Coordination normal.  Skin: Skin is warm and dry. No rash noted. She is not diaphoretic. No erythema. No pallor.  Psychiatric: She has a normal mood and affect. Her behavior is normal. Judgment and thought content normal.          Assessment & Plan:   Problem List Items Addressed This Visit     Unprioritized   Anxiety state, unspecified - Primary     Symptoms of anxiety and panic attacks associated with hot flashes. Will start prn Alprazolam, given symptoms are so infrequent.  Discussed potential risks of this medication including sedation.    Relevant Medications      ALPRAZolam  Duanne Moron) tablet   Right maxillary sinusitis  Symptoms and exam c/w right maxillary sinusitis. Will start Doxycycline bid. Continue prn antihistamines and ibuprofen.    Relevant Medications      DOXYCYCLINE HYCLATE 100 MG PO  TABS    Other Visit Diagnoses   Routine general medical examination at a health care facility        Relevant Orders       TSH       CBC with Differential       Comprehensive metabolic panel       Lipid panel       Microalbumin / creatinine urine ratio       Vit D  25 hydroxy (rtn osteoporosis monitoring)        Return in about 4 weeks (around 08/02/2014) for Physical.

## 2014-07-12 ENCOUNTER — Encounter: Payer: Self-pay | Admitting: Internal Medicine

## 2014-07-21 ENCOUNTER — Encounter: Payer: Self-pay | Admitting: Internal Medicine

## 2014-07-21 ENCOUNTER — Ambulatory Visit (INDEPENDENT_AMBULATORY_CARE_PROVIDER_SITE_OTHER): Payer: 59 | Admitting: Internal Medicine

## 2014-07-21 VITALS — BP 110/70 | HR 69 | Temp 97.8°F | Ht 68.0 in | Wt 187.5 lb

## 2014-07-21 DIAGNOSIS — J320 Chronic maxillary sinusitis: Secondary | ICD-10-CM

## 2014-07-21 DIAGNOSIS — Z23 Encounter for immunization: Secondary | ICD-10-CM

## 2014-07-21 NOTE — Progress Notes (Signed)
Pre visit review using our clinic review tool, if applicable. No additional management support is needed unless otherwise documented below in the visit note. 

## 2014-07-21 NOTE — Patient Instructions (Signed)
We will set up an evaluation with ENT this week.  Follow up here in 4 weeks.

## 2014-07-21 NOTE — Progress Notes (Signed)
   Subjective:    Patient ID: Heather Pope, female    DOB: 10-13-1965, 48 y.o.   MRN: [id number]  HPI 48YO female presents for follow up. Seen 9/28 for sinus infection. Treated at urgent care with Azithromycin, and then in our office with Doxycycline.  No improvement with medication. Having pressure and pain in right cheek and under eye. Also notes right ear pain. Not able to get any fluid out of sinus. No fever.   Review of Systems  Constitutional: Negative for fever, chills and unexpected weight change.  HENT: Positive for congestion, ear pain and sinus pressure. Negative for ear discharge, facial swelling, hearing loss, mouth sores, nosebleeds, postnasal drip, rhinorrhea, sneezing, sore throat, tinnitus, trouble swallowing and voice change.   Eyes: Negative for pain, discharge, redness and visual disturbance.  Respiratory: Negative for cough, chest tightness, shortness of breath, wheezing and stridor.   Cardiovascular: Negative for chest pain, palpitations and leg swelling.  Musculoskeletal: Negative for arthralgias, myalgias, neck pain and neck stiffness.  Skin: Negative for color change and rash.  Neurological: Negative for dizziness, weakness, light-headedness and headaches.  Hematological: Negative for adenopathy.       Objective:    BP 110/70  Pulse 69  Temp(Src) 97.8 F (36.6 C) (Oral)  Ht 5\' 8"  (1.727 m)  Wt 187 lb 8 oz (85.049 kg)  BMI 28.52 kg/m2  SpO2 97% Physical Exam  Constitutional: She is oriented to person, place, and time. She appears well-developed and well-nourished. No distress.  HENT:  Head: Normocephalic and atraumatic.  Right Ear: External ear normal. Tympanic membrane is bulging. A middle ear effusion is present.  Left Ear: Tympanic membrane and external ear normal.  Nose: Right sinus exhibits maxillary sinus tenderness.  Mouth/Throat: Oropharynx is clear and moist. No oropharyngeal exudate.  Eyes: Conjunctivae are normal. Pupils are  equal, round, and reactive to light. Right eye exhibits no discharge. Left eye exhibits no discharge. No scleral icterus.  Neck: Normal range of motion. Neck supple. No tracheal deviation present. No thyromegaly present.  Cardiovascular: Normal rate, regular rhythm, normal heart sounds and intact distal pulses.  Exam reveals no gallop and no friction rub.   No murmur heard. Pulmonary/Chest: Effort normal and breath sounds normal. No accessory muscle usage. Not tachypneic. No respiratory distress. She has no decreased breath sounds. She has no wheezes. She has no rhonchi. She has no rales. She exhibits no tenderness.  Musculoskeletal: Normal range of motion. She exhibits no edema and no tenderness.  Lymphadenopathy:    She has no cervical adenopathy.  Neurological: She is alert and oriented to person, place, and time. No cranial nerve deficit. She exhibits normal muscle tone. Coordination normal.  Skin: Skin is warm and dry. No rash noted. She is not diaphoretic. No erythema. No pallor.  Psychiatric: She has a normal mood and affect. Her behavior is normal. Judgment and thought content normal.          Assessment & Plan:   Problem List Items Addressed This Visit     Unprioritized   Right maxillary sinusitis - Primary     Persistent right maxillary sinus pain. Question if she may have abscess or polyp leading to obstruction. No improvement with Azithromycin and Doxycycline. Will set up ENT evaluation for direct visualization.    Relevant Orders      Ambulatory referral to ENT       Return in about 4 weeks (around 08/18/2014).

## 2014-07-21 NOTE — Assessment & Plan Note (Signed)
Persistent right maxillary sinus pain. Question if she may have abscess or polyp leading to obstruction. No improvement with Azithromycin and Doxycycline. Will set up ENT evaluation for direct visualization.

## 2014-07-22 ENCOUNTER — Telehealth: Payer: Self-pay | Admitting: Internal Medicine

## 2014-07-22 NOTE — Telephone Encounter (Signed)
emmi emailed °

## 2014-08-18 ENCOUNTER — Ambulatory Visit: Payer: 59 | Admitting: Internal Medicine

## 2015-04-22 ENCOUNTER — Encounter: Payer: Self-pay | Admitting: Internal Medicine

## 2015-04-22 ENCOUNTER — Other Ambulatory Visit: Payer: Self-pay | Admitting: Internal Medicine

## 2015-04-22 MED ORDER — CYCLOBENZAPRINE HCL 5 MG PO TABS: 5.0000 mg | ORAL_TABLET | Freq: Three times a day (TID) | ORAL | Status: DC | PRN

## 2015-04-22 NOTE — Telephone Encounter (Signed)
Please advise ok to refill flexeril

## 2015-04-25 ENCOUNTER — Other Ambulatory Visit: Payer: Self-pay | Admitting: Internal Medicine

## 2015-04-25 DIAGNOSIS — Z1231 Encounter for screening mammogram for malignant neoplasm of breast: Secondary | ICD-10-CM

## 2015-05-02 ENCOUNTER — Ambulatory Visit
Admission: RE | Admit: 2015-05-02 | Discharge: 2015-05-02 | Disposition: A | Discharge status: Home / Self Care | Payer: 59 | Source: Ambulatory Visit | Attending: Internal Medicine | Admitting: Internal Medicine

## 2015-05-02 ENCOUNTER — Other Ambulatory Visit: Payer: Self-pay | Admitting: Internal Medicine

## 2015-05-02 DIAGNOSIS — Z1231 Encounter for screening mammogram for malignant neoplasm of breast: Secondary | ICD-10-CM

## 2015-05-02 LAB — HM MAMMOGRAPHY

## 2015-05-11 ENCOUNTER — Encounter: Payer: Self-pay | Admitting: Internal Medicine

## 2015-05-11 ENCOUNTER — Ambulatory Visit (INDEPENDENT_AMBULATORY_CARE_PROVIDER_SITE_OTHER): Payer: 59 | Admitting: Internal Medicine

## 2015-05-11 VITALS — BP 111/73 | HR 68 | Temp 98.1°F | Ht 67.5 in | Wt 191.5 lb

## 2015-05-11 DIAGNOSIS — F4323 Adjustment disorder with mixed anxiety and depressed mood: Secondary | ICD-10-CM

## 2015-05-11 DIAGNOSIS — F17200 Nicotine dependence, unspecified, uncomplicated: Secondary | ICD-10-CM

## 2015-05-11 DIAGNOSIS — R1011 Right upper quadrant pain: Secondary | ICD-10-CM

## 2015-05-11 DIAGNOSIS — Z0000 Encounter for general adult medical examination without abnormal findings: Secondary | ICD-10-CM

## 2015-05-11 DIAGNOSIS — Z720 Tobacco use: Secondary | ICD-10-CM | POA: Diagnosis not present

## 2015-05-11 LAB — MICROALBUMIN / CREATININE URINE RATIO
CREATININE, U: 30.6 mg/dL
Microalb Creat Ratio: 2.3 mg/g (ref 0.0–30.0)
Microalb, Ur: <0.7 mg/dL (ref 0.0–1.9)

## 2015-05-11 MED ORDER — ALPRAZOLAM 0.25 MG PO TABS: 0.2500 mg | ORAL_TABLET | Freq: Three times a day (TID) | ORAL | Status: DC | PRN

## 2015-05-11 NOTE — Progress Notes (Signed)
Pre visit review using our clinic review tool, if applicable. No additional management support is needed unless otherwise documented below in the visit note. 

## 2015-05-11 NOTE — Assessment & Plan Note (Signed)
Encouraged smoking cessation 

## 2015-05-11 NOTE — Assessment & Plan Note (Signed)
RUQ abdominal pain and nausea. Exam remarkable for RUQ tenderness. Concerning for cholecystitis. Will check LFTs with labs. Check RUQ [place]. Follow up after imaging complete.

## 2015-05-11 NOTE — Patient Instructions (Signed)

## 2015-05-11 NOTE — Assessment & Plan Note (Signed)
General medical exam normal today including breast exam except as noted. PAP and pelvic deferred as pt s/p hysterectomy. Mammogram reviewed. Colonoscopy declined. Immunizations UTD. Labs as ordered. Encouraged healthy diet and exercise.

## 2015-05-11 NOTE — Progress Notes (Signed)
Subjective:    Patient ID: Heather Pope, female    DOB: 04/05/1966, 49 y.o.   MRN: [id number]  HPI  48YO female presents for annual exam.  Abdominal pain - Over the last 6 weeks, having some occasional right upper abdominal pain, bloating, and nausea after eating. Questions if this may be related to her gallbladder. Not taking anything for pain.  Difficult time for her. Some increased anxiety. Daughter diagnosed with possible MS. Taking some Alprazolam on occasion with some improvement.  Recent mammogram was normal. Prefers to hold off on Colonoscopy right now.   Past medical, surgical, family and social history per today's encounter.   Review of Systems  Constitutional: Negative for fever, chills, appetite change, fatigue and unexpected weight change.  Eyes: Negative for visual disturbance.  Respiratory: Negative for shortness of breath.   Cardiovascular: Negative for chest pain and leg swelling.  Gastrointestinal: Positive for nausea, abdominal pain and abdominal distention. Negative for vomiting, diarrhea and constipation.  Musculoskeletal: Negative for myalgias and arthralgias.  Skin: Negative for color change and rash.  Hematological: Negative for adenopathy. Does not bruise/bleed easily.  Psychiatric/Behavioral: Negative for sleep disturbance and dysphoric mood. The patient is nervous/anxious.        Objective:    BP 111/73 mmHg  Pulse 68  Temp(Src) 98.1 F (36.7 C) (Oral)  Ht 5' 7.5" (1.715 m)  Wt 191 lb 8 oz (86.864 kg)  BMI 29.53 kg/m2  SpO2 99% Physical Exam  Constitutional: She is oriented to person, place, and time. She appears well-developed and well-nourished. No distress.  HENT:  Head: Normocephalic and atraumatic.  Right Ear: External ear normal.  Left Ear: External ear normal.  Nose: Nose normal.  Mouth/Throat: Oropharynx is clear and moist. No oropharyngeal exudate.  Eyes: Conjunctivae are normal. Pupils are equal, round, and reactive  to light. Right eye exhibits no discharge. Left eye exhibits no discharge. No scleral icterus.  Neck: Normal range of motion. Neck supple. No tracheal deviation present. No thyromegaly present.  Cardiovascular: Normal rate, regular rhythm, normal heart sounds and intact distal pulses.  Exam reveals no gallop and no friction rub.   No murmur heard. Pulmonary/Chest: Effort normal and breath sounds normal. No accessory muscle usage. No tachypnea. No respiratory distress. She has no decreased breath sounds. She has no wheezes. She has no rales. She exhibits no tenderness. Right breast exhibits no inverted nipple, no mass, no nipple discharge, no skin change and no tenderness. Left breast exhibits no inverted nipple, no mass, no nipple discharge, no skin change and no tenderness. Breasts are symmetrical.  Abdominal: Soft. Normal appearance and bowel sounds are normal. She exhibits no distension and no mass. There is no hepatosplenomegaly. There is tenderness in the right upper quadrant. There is no rebound, no guarding and no CVA tenderness.  Musculoskeletal: Normal range of motion. She exhibits no edema or tenderness.  Lymphadenopathy:    She has no cervical adenopathy.  Neurological: She is alert and oriented to person, place, and time. No cranial nerve deficit. She exhibits normal muscle tone. Coordination normal.  Skin: Skin is warm and dry. No rash noted. She is not diaphoretic. No erythema. No pallor.  Psychiatric: Her behavior is normal. Judgment and thought content normal. Her mood appears anxious.          Assessment & Plan:   Problem List Items Addressed This Visit      Unprioritized   Abdominal pain, right upper quadrant    RUQ abdominal pain and  nausea. Exam remarkable for RUQ tenderness. Concerning for cholecystitis. Will check LFTs with labs. Check RUQ [place]. Follow up after imaging complete.      Relevant Orders   US Abdomen Limited RUQ   Adjustment disorder with mixed anxiety and  depressed mood    Continue Alprazolam. Offered support today.      Relevant Medications   ALPRAZolam (XANAX) 0.25 MG tablet   Routine general medical examination at a health care facility - Primary    General medical exam normal today including breast exam except as noted. PAP and pelvic deferred as pt s/p hysterectomy. Mammogram reviewed. Colonoscopy declined. Immunizations UTD. Labs as ordered. Encouraged healthy diet and exercise.      Relevant Orders   CBC with Differential/Platelet   Comprehensive metabolic panel   Lipid panel   Microalbumin / creatinine urine ratio   Vit D  25 hydroxy (rtn osteoporosis monitoring)   TSH   Smoker    Encouraged smoking cessation.          Return in about 1 year (around 05/10/2016) for Physical.

## 2015-05-11 NOTE — Assessment & Plan Note (Signed)
Continue Alprazolam. Offered support today.

## 2015-05-12 LAB — COMPREHENSIVE METABOLIC PANEL
ALT: 13 U/L (ref 0–35)
AST: 19 U/L (ref 0–37)
Albumin: 4.7 g/dL (ref 3.5–5.2)
Alkaline Phosphatase: 62 U/L (ref 39–117)
BILIRUBIN TOTAL: 0.5 mg/dL (ref 0.2–1.2)
BUN: 9 mg/dL (ref 6–23)
CALCIUM: 9.8 mg/dL (ref 8.4–10.5)
CO2: 28 mEq/L (ref 19–32)
Chloride: 104 mEq/L (ref 96–112)
Creatinine, Ser: 0.77 mg/dL (ref 0.40–1.20)
GFR: 84.04 mL/min (ref >60.00)
Glucose, Bld: 82 mg/dL (ref 70–99)
Potassium: 4.6 mEq/L (ref 3.5–5.1)
Sodium: 142 mEq/L (ref 135–145)
Total Protein: 7.2 g/dL (ref 6.0–8.3)

## 2015-05-12 LAB — VITAMIN D 25 HYDROXY (VIT D DEFICIENCY, FRACTURES): VITD: 14.22 ng/mL — ABNORMAL LOW (ref 30.00–100.00)

## 2015-05-12 LAB — CBC WITH DIFFERENTIAL/PLATELET
Basophils Absolute: 0 10*3/uL (ref 0.0–0.1)
Basophils Relative: 0.5 % (ref 0.0–3.0)
Eosinophils Absolute: 0.2 10*3/uL (ref 0.0–0.7)
Eosinophils Relative: 1.8 % (ref 0.0–5.0)
HCT: 43.7 % (ref 36.0–46.0)
HEMOGLOBIN: 14.5 g/dL (ref 12.0–15.0)
LYMPHS PCT: 27.2 % (ref 12.0–46.0)
Lymphs Abs: 2.2 10*3/uL (ref 0.7–4.0)
MCHC: 33.2 g/dL (ref 30.0–36.0)
MCV: 88.9 fl (ref 78.0–100.0)
MONOS PCT: 4.5 % (ref 3.0–12.0)
Monocytes Absolute: 0.4 10*3/uL (ref 0.1–1.0)
NEUTROS ABS: 5.5 10*3/uL (ref 1.4–7.7)
NEUTROS PCT: 66 % (ref 43.0–77.0)
PLATELETS: 259 10*3/uL (ref 150.0–400.0)
RBC: 4.92 Mil/uL (ref 3.87–5.11)
RDW: 13.7 % (ref 11.5–15.5)
WBC: 8.3 10*3/uL (ref 4.0–10.5)

## 2015-05-12 LAB — LIPID PANEL
Cholesterol: 184 mg/dL (ref 0–200)
HDL: 72.6 mg/dL (ref >39.00)
LDL Cholesterol: 96 mg/dL (ref 0–99)
NonHDL: 110.96
Total CHOL/HDL Ratio: 3
Triglycerides: 73 mg/dL (ref 0.0–149.0)
VLDL: 14.6 mg/dL (ref 0.0–40.0)

## 2015-05-12 LAB — TSH: TSH: 3.01 u[IU]/mL (ref 0.35–4.50)

## 2015-05-17 ENCOUNTER — Ambulatory Visit
Admission: RE | Admit: 2015-05-17 | Discharge: 2015-05-17 | Disposition: A | Discharge status: Home / Self Care | Payer: 59 | Source: Ambulatory Visit | Attending: Internal Medicine | Admitting: Internal Medicine

## 2015-05-17 DIAGNOSIS — R1011 Right upper quadrant pain: Secondary | ICD-10-CM | POA: Insufficient documentation

## 2015-07-27 ENCOUNTER — Telehealth: Payer: Self-pay | Admitting: *Deleted

## 2015-07-27 NOTE — Telephone Encounter (Signed)
Patient would like a flu shot Friday 10/21 at 1:30pm. -thanks

## 2015-07-27 NOTE — Telephone Encounter (Signed)
Completed and on the schedule.

## 2015-07-29 ENCOUNTER — Ambulatory Visit (INDEPENDENT_AMBULATORY_CARE_PROVIDER_SITE_OTHER): Payer: 59 | Admitting: Surgical

## 2015-07-29 DIAGNOSIS — Z23 Encounter for immunization: Secondary | ICD-10-CM | POA: Diagnosis not present

## 2015-11-21 ENCOUNTER — Ambulatory Visit: Payer: Self-pay | Admitting: Physician Assistant

## 2015-11-21 ENCOUNTER — Encounter: Payer: Self-pay | Admitting: Physician Assistant

## 2015-11-21 VITALS — BP 130/80 | HR 76 | Temp 97.9°F

## 2015-11-21 DIAGNOSIS — R42 Dizziness and giddiness: Secondary | ICD-10-CM

## 2015-11-21 DIAGNOSIS — H6982 Other specified disorders of Eustachian tube, left ear: Secondary | ICD-10-CM

## 2015-11-21 MED ORDER — MECLIZINE HCL 25 MG PO TABS: 25.0000 mg | ORAL_TABLET | Freq: Three times a day (TID) | ORAL | Status: DC | PRN

## 2015-11-21 MED ORDER — PREDNISONE 10 MG PO TABS: 30.0000 mg | ORAL_TABLET | Freq: Every day | ORAL | Status: DC

## 2015-11-21 NOTE — Progress Notes (Signed)
S: c/o left ear fullness, pressure behind left ear, dizziness, no v but some nausea, gets more dizzy with sudden movement or going from sitting to standing, no sinus congestion, denies cp/sob  O: vitals wnl, nad, perrl eomi, left tm dull, area below ear tender, no rash noted, nasal mucosa inflamed, throat with tonsil stone on left tonsil, no redness noted, neck supple no lymph, lungs c t a, cv rrr  A: dizziness, vertigo, eustachean tube dysfunction  P: antivert, prednisone 30mg  qd x 3d, flonase, return if worsening, go to ER if severe dizziness or if worsening

## 2015-11-23 ENCOUNTER — Ambulatory Visit
Admission: EM | Admit: 2015-11-23 | Discharge: 2015-11-23 | Disposition: A | Discharge status: Home / Self Care | Payer: 59 | Attending: Family Medicine | Admitting: Family Medicine

## 2015-11-23 DIAGNOSIS — K047 Periapical abscess without sinus: Secondary | ICD-10-CM | POA: Diagnosis not present

## 2015-11-23 DIAGNOSIS — K0889 Other specified disorders of teeth and supporting structures: Secondary | ICD-10-CM

## 2015-11-23 MED ORDER — CLINDAMYCIN HCL 300 MG PO CAPS: 300.0000 mg | ORAL_CAPSULE | Freq: Four times a day (QID) | ORAL | Status: DC

## 2015-11-23 MED ORDER — CLINDAMYCIN HCL 300 MG PO CAPS: 300.0000 mg | ORAL_CAPSULE | Freq: Four times a day (QID) | ORAL | Status: AC

## 2015-11-23 NOTE — ED Provider Notes (Signed)
[facility]    Time seen: Approximately [id number]  PM  I have reviewed the triage vital signs and the nursing notes.   HISTORY  Chief Complaint Dental Pain  HPI Angala Hanscom is a 50 y.o. female presents for the complaint of left lower dental pain. Patient reports that she has had intermittent dental pain in the past but nothing recently. Patient reports that she has several broken teeth has been present times years. Patient states she has not seen a dentist in at least 4 years. Patient states that pain has been present left lower dental area 4 days. Patient states that she's also had some accompanying chills but denies fevers.  States current dental pain is 6 out of 10 aching and throbbing to left lower gumline. Denies pain radiation.  Reports continues to eat and drink well. Denies nausea, vomiting, diarrhea, pain radiation. Denies neck or back pain. Denies rash.  Patient reports that she was recently seen by employee health this past week for intermittent dizziness. Patient denies current dizziness. Denies chest pain, shortness of breath, abdominal pain, weakness, vision changes, headache. Denies fall or trauma. Denies recent sickness.  States no antibiotic use in the last year.  PCP: walker   Past Medical History  Diagnosis Date  . Endometriosis     Patient Active Problem List   Diagnosis Date Noted  . Routine general medical examination at a health care facility 05/11/2015  . Adjustment disorder with mixed anxiety and depressed mood 05/11/2015  . Abdominal pain, right upper quadrant 05/11/2015  . Smoker 03/24/2013  . Palpitations 03/19/2013  . Lateral meniscal tear 05/28/2012    Past Surgical History  Procedure Laterality Date  . Spine surgery  2011    L4-5, S1 fusion, Dr. Mauri Pole  . Breast surgery      Dr. Koleen Nimrod, normal  . Vaginal delivery      3  . Abdominal hysterectomy      Dr. Vernie Ammons, one ovary in place  . Spinal fusion      Current  Outpatient Rx  Name  Route  Sig  Dispense  Refill  . acetaminophen (TYLENOL) 500 MG tablet   Oral   Take 500 mg by mouth every 6 (six) hours as needed. 2 tablets three times daily          . ALPRAZolam (XANAX) 0.25 MG tablet   Oral   Take 1 tablet (0.25 mg total) by mouth 3 (three) times daily as needed for anxiety.   90 tablet   1   . cyclobenzaprine (FLEXERIL) 5 MG tablet   Oral   Take 1 tablet (5 mg total) by mouth 3 (three) times daily as needed for muscle spasms.   30 tablet   0   .           . meclizine (ANTIVERT) 25 MG tablet   Oral   Take 1 tablet (25 mg total) by mouth 3 (three) times daily as needed for dizziness.   30 tablet   0                Allergies Levaquin; Oxycodone; and Penicillins  Family History  Problem Relation Age of Onset  . Heart disease Mother   . Diabetes Mother   . Cancer Maternal Aunt     brain  . Heart disease Maternal Grandmother   . Heart disease Maternal Grandfather     CABG  . Multiple sclerosis Daughter     Social History Social History  Substance Use Topics  . Smoking status: Current Every Day Smoker -- 0.50 packs/day for 35 years    Types: Cigarettes  . Smokeless tobacco: Never Used  . Alcohol Use: No    Review of Systems Constitutional: No fever/chills. Reports continues to eat and drink foods and fluids well.  Eyes: No visual changes. ENT: No sore throat. Positive dental pain. Cardiovascular: Denies chest pain. Respiratory: Denies shortness of breath. Gastrointestinal: No abdominal pain.  No nausea, no vomiting.  Genitourinary: Negative for dysuria. Musculoskeletal: Negative for back pain. Skin: Negative for rash. Neurological: Negative for headaches, focal weakness or numbness. 10-point ROS otherwise negative.  ____________________________________________   PHYSICAL EXAM:  VITAL SIGNS: ED Triage Vitals  Enc Vitals Group     BP 11/23/15 1651 127/78 mmHg     Pulse Rate 11/23/15 1651 60     Resp  11/23/15 1651 16     Temp 11/23/15 1651 96.6 F (35.9 C)     Temp Source 11/23/15 1651 Tympanic     SpO2 11/23/15 1651 100 %     Weight 11/23/15 1651 190 lb (86.183 kg)     Height 11/23/15 1651 5\' 9"  (1.753 m)     Head Cir --      Peak Flow --      Pain Score 11/23/15 1656 9     Pain Loc --      Pain Edu? --      Excl. in [place]? --     Constitutional: Alert and oriented. Well appearing and in no acute distress. Eyes: Conjunctivae are normal. PERRL. EOMI. Head: Atraumatic. No facial swelling. No erythema, rash or induration. No fluctuance. Ears: Bilateral ears no erythema, normal TMs.  Nose: No congestion/rhinnorhea. Mouth/Throat: Mucous membranes are moist.  Oropharynx non-erythematous. Periodontal Exam    widespread dental decay with multiple fractures and dental caries visible. Fractured teeth along left lower 18 through 20 with moderate point tenderness along 18 through 20 tooth gumline with accompanying erythema and gumline swelling. No palpable or visualized abscess or fluctuance noted. No drainage. Neck: No stridor.  Hematological/Lymphatic/Immunilogical: No cervical lymphadenopathy. Cardiovascular:   Normal rate, regular rhythm. Grossly normal heart sounds. Good peripheral circulation. Respiratory: Normal respiratory effort.  No retractions. Musculoskeletal: No lower or upper extremity tenderness nor edema.  Neurologic:  Normal speech and language. No gross focal neurologic deficits are appreciated. Speech is normal. No gait instability. Skin:  Skin is warm, dry and intact. No rash noted. Psychiatric: Mood and affect are normal. Speech and behavior are normal.  ____________________________________________   LABS (all labs ordered are listed, but only abnormal results are displayed)  Labs Reviewed - No data to display  INITIAL IMPRESSION / ASSESSMENT AND PLAN / ED COURSE  Pertinent labs & imaging results that were available during my care of the patient were reviewed by  me and considered in my medical decision making (see chart for details).  Very well-appearing patient. No acute distress. Presents for the complaints of left lower dental pain. Patient with widespread dental decay with multiple dental caries as well as dental fractures. Patient with point dental tenderness, with gumline erythema and swelling. Patient states that she called dentist today and was able to get and be seen today. Patient states that she will call tomorrow to follow-up appointment with dentist within the next week. Concern for developing dental infection and dental abscess. Patient penicillin and Levaquin allergic. Denies antibiotic use in last year. Will treat with oral clindamycin. Directed to monitor for GI side effects,  and follow-up closely with PCP.  Patient states that she does not tolerate pain medications very well and states that she will take over-the-counter Tylenol or ibuprofen as needed for pain.  Patient was advised to see the dentist within 14 days. Also advised to take the antibiotic until finished. Instructed to return to the Urgent care or ER for symptoms that change or worsen or if unable to schedule an appointment. Follow up with PCP this week, patient agreed to plan.  ____________________________________________   FINAL CLINICAL IMPRESSION(S) / ED DIAGNOSES  Final diagnoses:  Pain, dental  Dental infection        Marylene Land, NP 11/23/15 1821  Marylene Land, NP 11/23/15 [id number]

## 2015-11-23 NOTE — Discharge Instructions (Signed)
Take medication as prescribed. Take over the counter tylenol or ibuprofen as needed. Rest. Drink plenty of fluids.   Follow up with dentist this week.   Follow up with your primary care physician this week as needed. Return to Urgent care for new or worsening concerns.    Dental Abscess A dental abscess is a collection of pus in or around a tooth. CAUSES This condition is caused by a bacterial infection around the root of the tooth that involves the inner part of the tooth (pulp). It may result from:  Severe tooth decay.  Trauma to the tooth that allows bacteria to enter into the pulp, such as a broken or chipped tooth.  Severe gum disease around a tooth. SYMPTOMS Symptoms of this condition include:  Severe pain in and around the infected tooth.  Swelling and redness around the infected tooth, in the mouth, or in the face.  Tenderness.  Pus drainage.  Bad breath.  Bitter taste in the mouth.  Difficulty swallowing.  Difficulty opening the mouth.  Nausea.  Vomiting.  Chills.  Swollen neck glands.  Fever. DIAGNOSIS This condition is diagnosed with examination of the infected tooth. During the exam, your dentist may tap on the infected tooth. Your dentist will also ask about your medical and dental history and may order X-rays. TREATMENT This condition is treated by eliminating the infection. This may be done with:  Antibiotic medicine.  A root canal. This may be performed to save the tooth.  Pulling (extracting) the tooth. This may also involve draining the abscess. This is done if the tooth cannot be saved. HOME CARE INSTRUCTIONS  Take medicines only as directed by your dentist.  If you were prescribed antibiotic medicine, finish all of it even if you start to feel better.  Rinse your mouth (gargle) often with salt water to relieve pain or swelling.  Do not drive or operate heavy machinery while taking pain medicine.  Do not apply heat to the outside of  your mouth.  Keep all follow-up visits as directed by your dentist. This is important. SEEK MEDICAL CARE IF:  Your pain is worse and is not helped by medicine. SEEK IMMEDIATE MEDICAL CARE IF:  You have a fever or chills.  Your symptoms suddenly get worse.  You have a very bad headache.  You have problems breathing or swallowing.  You have trouble opening your mouth.  You have swelling in your neck or around your eye.   This information is not intended to replace advice given to you by your health care provider. Make sure you discuss any questions you have with your health care provider.   Document Released: 09/24/2005 Document Revised: 02/08/2015 Document Reviewed: 09/21/2014 Elsevier Interactive Patient Education 2016 [place] Pain Dental pain may be caused by many things, including:  Tooth decay (cavities or caries). Cavities expose the nerve of your tooth to air and hot or cold temperatures. This can cause pain or discomfort.  Abscess or infection. A dental abscess is a collection of infected pus from a bacterial infection in the inner part of the tooth (pulp). It usually occurs at the end of the tooth's root.  Injury.  An unknown reason (idiopathic). Your pain may be mild or severe. It may only occur when:  You are chewing.  You are exposed to hot or cold temperature.  You are eating or drinking sugary foods or beverages, such as soda or candy. Your pain may also be constant. HOME CARE INSTRUCTIONS  Watch your dental pain for any changes. The following actions may help to lessen any discomfort that you are feeling:  Take medicines only as directed by your dentist.  If you were prescribed an antibiotic medicine, finish all of it even if you start to feel better.  Keep all follow-up visits as directed by your dentist. This is important.  Do not apply heat to the outside of your face.  Rinse your mouth or gargle with salt water if directed by your  dentist. This helps with pain and swelling.  You can make salt water by adding  tsp of salt to 1 cup of warm water.  Apply ice to the painful area of your face:  Put ice in a plastic bag.  Place a towel between your skin and the bag.  Leave the ice on for 20 minutes, 2-3 times per day.  Avoid foods or drinks that cause you pain, such as:  Very hot or very cold foods or drinks.  Sweet or sugary foods or drinks. SEEK MEDICAL CARE IF:  Your pain is not controlled with medicines.  Your symptoms are worse.  You have new symptoms. SEEK IMMEDIATE MEDICAL CARE IF:  You are unable to open your mouth.  You are having trouble breathing or swallowing.  You have a fever.  Your face, neck, or jaw is swollen.   This information is not intended to replace advice given to you by your health care provider. Make sure you discuss any questions you have with your health care provider.   Document Released: 09/24/2005 Document Revised: 02/08/2015 Document Reviewed: 09/20/2014 Elsevier Interactive Patient Education [employer].

## 2015-11-23 NOTE — ED Notes (Signed)
C/o pressure left bottom molar x 4 days. Today woke with severe toothache and chills

## 2015-11-28 ENCOUNTER — Telehealth: Payer: Self-pay | Admitting: *Deleted

## 2015-11-28 NOTE — Telephone Encounter (Signed)
Patient was seen by her dentist and was given an antibiotic, the antibiotic has cause patient to have nausea. She requested something to help her nausea. Please advise Pt contact  [phone]

## 2015-11-30 MED ORDER — ONDANSETRON HCL 8 MG PO TABS: 8.0000 mg | ORAL_TABLET | Freq: Three times a day (TID) | ORAL | Status: DC | PRN

## 2015-11-30 NOTE — Telephone Encounter (Signed)
Please advise for nausea meds?

## 2015-11-30 NOTE — Telephone Encounter (Signed)
Fine to call in Ondansetron 8mg  po tid prn. #30 no refill

## 2015-11-30 NOTE — Telephone Encounter (Signed)
Spoke with the patient, sent in Zofran as ordered to the [facility] pharmacy. Thanks

## 2016-04-30 ENCOUNTER — Telehealth: Payer: Self-pay | Admitting: *Deleted

## 2016-04-30 NOTE — Telephone Encounter (Signed)
Yes, fine to schedule with NP

## 2016-04-30 NOTE — Telephone Encounter (Signed)
Please advise, can patient be seen by NP? thanks

## 2016-04-30 NOTE — Telephone Encounter (Signed)
Patient has request to be seen by Dr. Gilford Rile for her physical, she was informed that she may have to be scheduled with another provider. She would like to come in on Wednesday or Thursday of next week.

## 2016-04-30 NOTE — Telephone Encounter (Signed)
She will need to schedule with Joycelyn Schmid, thanks

## 2016-04-30 NOTE — Telephone Encounter (Signed)
scheduled

## 2016-05-02 ENCOUNTER — Other Ambulatory Visit: Payer: Self-pay | Admitting: Internal Medicine

## 2016-05-02 DIAGNOSIS — Z1231 Encounter for screening mammogram for malignant neoplasm of breast: Secondary | ICD-10-CM

## 2016-05-09 ENCOUNTER — Other Ambulatory Visit: Payer: Self-pay | Admitting: Internal Medicine

## 2016-05-09 ENCOUNTER — Ambulatory Visit
Admission: RE | Admit: 2016-05-09 | Discharge: 2016-05-09 | Disposition: A | Discharge status: Home / Self Care | Payer: 59 | Source: Ambulatory Visit | Attending: Internal Medicine | Admitting: Internal Medicine

## 2016-05-09 DIAGNOSIS — Z1231 Encounter for screening mammogram for malignant neoplasm of breast: Secondary | ICD-10-CM | POA: Insufficient documentation

## 2016-05-16 ENCOUNTER — Encounter: Payer: Self-pay | Admitting: Family

## 2016-05-16 ENCOUNTER — Ambulatory Visit (INDEPENDENT_AMBULATORY_CARE_PROVIDER_SITE_OTHER): Payer: 59 | Admitting: Family

## 2016-05-16 ENCOUNTER — Telehealth: Payer: Self-pay

## 2016-05-16 VITALS — BP 120/80 | HR 78 | Temp 97.9°F | Ht 68.0 in | Wt 196.4 lb

## 2016-05-16 DIAGNOSIS — F4323 Adjustment disorder with mixed anxiety and depressed mood: Secondary | ICD-10-CM

## 2016-05-16 DIAGNOSIS — Z0000 Encounter for general adult medical examination without abnormal findings: Secondary | ICD-10-CM

## 2016-05-16 DIAGNOSIS — M545 Low back pain, unspecified: Secondary | ICD-10-CM

## 2016-05-16 LAB — CBC WITH DIFFERENTIAL/PLATELET
BASOS PCT: 0.3 % (ref 0.0–3.0)
Basophils Absolute: 0 10*3/uL (ref 0.0–0.1)
Eosinophils Absolute: 0.2 10*3/uL (ref 0.0–0.7)
Eosinophils Relative: 1.9 % (ref 0.0–5.0)
HCT: 46 % (ref 36.0–46.0)
Hemoglobin: 15.2 g/dL — ABNORMAL HIGH (ref 12.0–15.0)
LYMPHS ABS: 2.4 10*3/uL (ref 0.7–4.0)
Lymphocytes Relative: 27.5 % (ref 12.0–46.0)
MCHC: 33.1 g/dL (ref 30.0–36.0)
MCV: 87.1 fl (ref 78.0–100.0)
MONO ABS: 0.4 10*3/uL (ref 0.1–1.0)
Monocytes Relative: 5.2 % (ref 3.0–12.0)
NEUTROS PCT: 65.1 % (ref 43.0–77.0)
Neutro Abs: 5.7 10*3/uL (ref 1.4–7.7)
Platelets: 288 10*3/uL (ref 150.0–400.0)
RBC: 5.28 Mil/uL — ABNORMAL HIGH (ref 3.87–5.11)
RDW: 13.9 % (ref 11.5–15.5)
WBC: 8.7 10*3/uL (ref 4.0–10.5)

## 2016-05-16 LAB — LIPID PANEL
CHOL/HDL RATIO: 3
CHOLESTEROL: 171 mg/dL (ref 0–200)
HDL: 63.6 mg/dL (ref >39.00)
LDL CALC: 90 mg/dL (ref 0–99)
NonHDL: 107.2
TRIGLYCERIDES: 84 mg/dL (ref 0.0–149.0)
VLDL: 16.8 mg/dL (ref 0.0–40.0)

## 2016-05-16 LAB — COMPREHENSIVE METABOLIC PANEL
ALT: 11 U/L (ref 0–35)
AST: 13 U/L (ref 0–37)
Albumin: 4.8 g/dL (ref 3.5–5.2)
Alkaline Phosphatase: 67 U/L (ref 39–117)
BUN: 13 mg/dL (ref 6–23)
CHLORIDE: 102 meq/L (ref 96–112)
CO2: 31 meq/L (ref 19–32)
Calcium: 10 mg/dL (ref 8.4–10.5)
Creatinine, Ser: 0.82 mg/dL (ref 0.40–1.20)
GFR: 77.84 mL/min (ref >60.00)
GLUCOSE: 91 mg/dL (ref 70–99)
POTASSIUM: 4.7 meq/L (ref 3.5–5.1)
Sodium: 138 mEq/L (ref 135–145)
Total Bilirubin: 0.4 mg/dL (ref 0.2–1.2)
Total Protein: 7.3 g/dL (ref 6.0–8.3)

## 2016-05-16 LAB — TSH: TSH: 3.58 u[IU]/mL (ref 0.35–4.50)

## 2016-05-16 LAB — VITAMIN D 25 HYDROXY (VIT D DEFICIENCY, FRACTURES): VITD: 9.96 ng/mL — ABNORMAL LOW (ref 30.00–100.00)

## 2016-05-16 LAB — HEMOGLOBIN A1C: Hgb A1c MFr Bld: 5.5 % (ref 4.6–6.5)

## 2016-05-16 MED ORDER — ALPRAZOLAM 0.25 MG PO TABS: 0.2500 mg | ORAL_TABLET | Freq: Three times a day (TID) | ORAL | 0 refills | Status: DC | PRN

## 2016-05-16 MED ORDER — CYCLOBENZAPRINE HCL 5 MG PO TABS: 5.0000 mg | ORAL_TABLET | Freq: Three times a day (TID) | ORAL | 0 refills | Status: DC | PRN

## 2016-05-16 NOTE — Progress Notes (Signed)
Pre visit review using our clinic review tool, if applicable. No additional management support is needed unless otherwise documented below in the visit note. 

## 2016-05-16 NOTE — Patient Instructions (Signed)
Pleasure meeting you today.  Menopause is a normal process in which your reproductive ability comes to an end. This process happens gradually over a span of months to years, usually between the ages of 67 and 51. Menopause is complete when you have missed 12 consecutive menstrual periods. It is important to talk with your health care provider about some of the most common conditions that affect postmenopausal women, such as heart disease, cancer, and bone loss (osteoporosis). Adopting a healthy lifestyle and getting preventive care can help to promote your health and wellness. Those actions can also lower your chances of developing some of these common conditions. WHAT SHOULD I KNOW ABOUT MENOPAUSE? During menopause, you may experience a number of symptoms, such as:  Moderate-to-severe hot flashes.  Night sweats.  Decrease in sex drive.  Mood swings.  Headaches.  Tiredness.  Irritability.  Memory problems.  Insomnia. Choosing to treat or not to treat menopausal changes is an individual decision that you make with your health care provider. WHAT SHOULD I KNOW ABOUT HORMONE REPLACEMENT THERAPY AND SUPPLEMENTS? Hormone therapy products are effective for treating symptoms that are associated with menopause, such as hot flashes and night sweats. Hormone replacement carries certain risks, especially as you become older. If you are thinking about using estrogen or estrogen with progestin treatments, discuss the benefits and risks with your health care provider. WHAT SHOULD I KNOW ABOUT HEART DISEASE AND STROKE? Heart disease, heart attack, and stroke become more likely as you age. This may be due, in part, to the hormonal changes that your body experiences during menopause. These can affect how your body processes dietary fats, triglycerides, and cholesterol. Heart attack and stroke are both medical emergencies. There are many things that you can do to help prevent heart disease and  stroke:  Have your blood pressure checked at least every 1-2 years. High blood pressure causes heart disease and increases the risk of stroke.  If you are 50-26 years old, ask your health care provider if you should take aspirin to prevent a heart attack or a stroke.  Do not use any tobacco products, including cigarettes, chewing tobacco, or electronic cigarettes. If you need help quitting, ask your health care provider.  It is important to eat a healthy diet and maintain a healthy weight.  Be sure to include plenty of vegetables, fruits, low-fat dairy products, and lean protein.  Avoid eating foods that are high in solid fats, added sugars, or salt (sodium).  Get regular exercise. This is one of the most important things that you can do for your health.  Try to exercise for at least 150 minutes each week. The type of exercise that you do should increase your heart rate and make you sweat. This is known as moderate-intensity exercise.  Try to do strengthening exercises at least twice each week. Do these in addition to the moderate-intensity exercise.  Know your numbers.Ask your health care provider to check your cholesterol and your blood glucose. Continue to have your blood tested as directed by your health care provider. WHAT SHOULD I KNOW ABOUT CANCER SCREENING? There are several types of cancer. Take the following steps to reduce your risk and to catch any cancer development as early as possible. Breast Cancer  Practice breast self-awareness.  This means understanding how your breasts normally appear and feel.  It also means doing regular breast self-exams. Let your health care provider know about any changes, no matter how small.  If you are 40 or older,  have a clinician do a breast exam (clinical breast exam or CBE) every year. Depending on your age, family history, and medical history, it may be recommended that you also have a yearly breast X-ray (mammogram).  If you have a  family history of breast cancer, talk with your health care provider about genetic screening.  If you are at high risk for breast cancer, talk with your health care provider about having an MRI and a mammogram every year.  Breast cancer (BRCA) gene test is recommended for women who have family members with BRCA-related cancers. Results of the assessment will determine the need for genetic counseling and BRCA1 and for BRCA2 testing. BRCA-related cancers include these types:  Breast. This occurs in males or females.  Ovarian.  Tubal. This may also be called fallopian tube cancer.  Cancer of the abdominal or pelvic lining (peritoneal cancer).  Prostate.  Pancreatic. Cervical, Uterine, and Ovarian Cancer Your health care provider may recommend that you be screened regularly for cancer of the pelvic organs. These include your ovaries, uterus, and vagina. This screening involves a pelvic exam, which includes checking for microscopic changes to the surface of your cervix (Pap test).  For women ages 21-65, health care providers may recommend a pelvic exam and a Pap test every three years. For women ages 38-65, they may recommend the Pap test and pelvic exam, combined with testing for human papilloma virus (HPV), every five years. Some types of HPV increase your risk of cervical cancer. Testing for HPV may also be done on women of any age who have unclear Pap test results.  Other health care providers may not recommend any screening for nonpregnant women who are considered low risk for pelvic cancer and have no symptoms. Ask your health care provider if a screening pelvic exam is right for you.  If you have had past treatment for cervical cancer or a condition that could lead to cancer, you need Pap tests and screening for cancer for at least 20 years after your treatment. If Pap tests have been discontinued for you, your risk factors (such as having a new sexual partner) need to be reassessed to  determine if you should start having screenings again. Some women have medical problems that increase the chance of getting cervical cancer. In these cases, your health care provider may recommend that you have screening and Pap tests more often.  If you have a family history of uterine cancer or ovarian cancer, talk with your health care provider about genetic screening.  If you have vaginal bleeding after reaching menopause, tell your health care provider.  There are currently no reliable tests available to screen for ovarian cancer. Lung Cancer Lung cancer screening is recommended for adults 50-75 years old who are at high risk for lung cancer because of a history of smoking. A yearly low-dose CT scan of the lungs is recommended if you:  Currently smoke.  Have a history of at least 30 pack-years of smoking and you currently smoke or have quit within the past 15 years. A pack-year is smoking an average of one pack of cigarettes per day for one year. Yearly screening should:  Continue until it has been 15 years since you quit.  Stop if you develop a health problem that would prevent you from having lung cancer treatment. Colorectal Cancer  This type of cancer can be detected and can often be prevented.  Routine colorectal cancer screening usually begins at age 10 and continues through age  75.  If you have risk factors for colon cancer, your health care provider may recommend that you be screened at an earlier age.  If you have a family history of colorectal cancer, talk with your health care provider about genetic screening.  Your health care provider may also recommend using home test kits to check for hidden blood in your stool.  A small camera at the end of a tube can be used to examine your colon directly (sigmoidoscopy or colonoscopy). This is done to check for the earliest forms of colorectal cancer.  Direct examination of the colon should be repeated every 5-10 years until age  75. However, if early forms of precancerous polyps or small growths are found or if you have a family history or genetic risk for colorectal cancer, you may need to be screened more often. Skin Cancer  Check your skin from head to toe regularly.  Monitor any moles. Be sure to tell your health care provider:  About any new moles or changes in moles, especially if there is a change in a mole's shape or color.  If you have a mole that is larger than the size of a pencil eraser.  If any of your family members has a history of skin cancer, especially at a young age, talk with your health care provider about genetic screening.  Always use sunscreen. Apply sunscreen liberally and repeatedly throughout the day.  Whenever you are outside, protect yourself by wearing long sleeves, pants, a wide-brimmed hat, and sunglasses. WHAT SHOULD I KNOW ABOUT OSTEOPOROSIS? Osteoporosis is a condition in which bone destruction happens more quickly than new bone creation. After menopause, you may be at an increased risk for osteoporosis. To help prevent osteoporosis or the bone fractures that can happen because of osteoporosis, the following is recommended:  If you are 93-70 years old, get at least 1,000 mg of calcium and at least 600 mg of vitamin D per day.  If you are older than age 74 but younger than age 45, get at least 1,200 mg of calcium and at least 600 mg of vitamin D per day.  If you are older than age 33, get at least 1,200 mg of calcium and at least 800 mg of vitamin D per day. Smoking and excessive alcohol intake increase the risk of osteoporosis. Eat foods that are rich in calcium and vitamin D, and do weight-bearing exercises several times each week as directed by your health care provider. WHAT SHOULD I KNOW ABOUT HOW MENOPAUSE AFFECTS [place]? Depression may occur at any age, but it is more common as you become older. Common symptoms of depression include:  Low or sad mood.  Changes  in sleep patterns.  Changes in appetite or eating patterns.  Feeling an overall lack of motivation or enjoyment of activities that you previously enjoyed.  Frequent crying spells. Talk with your health care provider if you think that you are experiencing depression. WHAT SHOULD I KNOW ABOUT IMMUNIZATIONS? It is important that you get and maintain your immunizations. These include:  Tetanus, diphtheria, and pertussis (Tdap) booster vaccine.  Influenza every year before the flu season begins.  Pneumonia vaccine.  Shingles vaccine. Your health care provider may also recommend other immunizations.   This information is not intended to replace advice given to you by your health care provider. Make sure you discuss any questions you have with your health care provider.   Document Released: 11/16/2005 Document Revised: 10/15/2014 Document Reviewed: 05/27/2014 Elsevier  Interactive Patient Education [employer].

## 2016-05-16 NOTE — Assessment & Plan Note (Signed)
Primarily anxiety. Stable and well controlled. She is using her Xanax as prescribed and appropriately.

## 2016-05-16 NOTE — Progress Notes (Signed)
Subjective:    Patient ID: Heather Pope, female    DOB: [birth year], 50 y.o.   MRN: [id number]  CC: Heather Pope is a 50 y.o. female who presents today for physical exam.    HPI: Here to establish care and for physical.   Needs refills.   Anxiety: Started with menopause 2 years ago. One prescription lasts for a year. Has heart palpitations, usually during hot flash. Uses 2x /month. No depression.   Chronic LBP wo sciatica: Uses flexeril on occasion especially after working 3 shifts in a row. No history of cancer. No LE weakness, falls, saddle anesthesia, or urinary or bowel incontinence.      Colorectal Cancer Screening: Due; will do cologuard Breast Cancer Screening: Mammogram UTD,  Last normal.  Cervical Cancer Screening:No longer screens. History of hysterectomy. No h/o abnormal pap smears.  Lung Cancer Screening: Doesn't have 30 year pack year history and age > 31 years. Immunizations       Tetanus - Believes had 3 years ago; will send [place] record Hepatitis C screening - Candidate for; will do HIV Screening- Candidate for; will do.  Labs: Screening labs today. Exercise: Gets regular exercise.  Alcohol use: No Smoking/tobacco use: Smoker.  Regular dental exams: In need of dental exam. Wears seat belt: Yes.  HISTORY:  Past Medical History:  Diagnosis Date  . Endometriosis     Past Surgical History:  Procedure Laterality Date  . ABDOMINAL HYSTERECTOMY     Dr. Vernie Ammons, one ovary in place  . BREAST SURGERY     Dr. Koleen Nimrod, normal  . SPINAL FUSION    . SPINE SURGERY  2011   L4-5, S1 fusion, Dr. Mauri Pole  . VAGINAL DELIVERY     3   Family History  Problem Relation Age of Onset  . Heart disease Mother   . Diabetes Mother   . Heart disease Maternal Grandmother   . Heart disease Maternal Grandfather     CABG  . Multiple sclerosis Daughter   . Cancer Maternal Aunt     brain  . Breast cancer Neg Hx       ALLERGIES: Levaquin [levofloxacin  in d5w]; Oxycodone; and Penicillins  Current Outpatient Prescriptions on File Prior to Visit  Medication Sig Dispense Refill  . acetaminophen (TYLENOL) 500 MG tablet Take 500 mg by mouth every 6 (six) hours as needed. 2 tablets three times daily     . meclizine (ANTIVERT) 25 MG tablet Take 1 tablet (25 mg total) by mouth 3 (three) times daily as needed for dizziness. 30 tablet 0  . ondansetron (ZOFRAN) 8 MG tablet Take 1 tablet (8 mg total) by mouth 3 (three) times daily as needed for nausea or vomiting. 30 tablet 0   No current facility-administered medications on file prior to visit.     Social History  Substance Use Topics  . Smoking status: Current Every Day Smoker    Packs/day: 0.50    Years: 35.00    Types: Cigarettes  . Smokeless tobacco: Never Used  . Alcohol use No    Review of Systems  Constitutional: Negative for chills, fever and unexpected weight change.  HENT: Negative for congestion.   Respiratory: Negative for cough.   Cardiovascular: Negative for chest pain, palpitations and leg swelling.  Gastrointestinal: Negative for nausea and vomiting.  Musculoskeletal: Positive for back pain (chronic). Negative for arthralgias and myalgias.  Skin: Negative for rash.  Neurological: Negative for headaches.  Hematological: Negative for adenopathy.  Psychiatric/Behavioral: Negative  for confusion and suicidal ideas.      Objective:    BP 120/80 (BP Location: Left Arm, Patient Position: Sitting, Cuff Size: Large)   Pulse 78   Temp 97.9 F (36.6 C) (Oral)   Ht 5\' 8"  (1.727 m)   Wt 196 lb 6.4 oz (89.1 kg)   SpO2 99%   BMI 29.86 kg/m   BP Readings from Last 3 Encounters:  05/16/16 120/80  11/23/15 127/78  11/21/15 130/80   Wt Readings from Last 3 Encounters:  05/16/16 196 lb 6.4 oz (89.1 kg)  11/23/15 190 lb (86.2 kg)  05/11/15 191 lb 8 oz (86.9 kg)    Physical Exam  Constitutional: She appears well-developed and well-nourished.  Eyes: Conjunctivae are normal.    Neck: No thyroid mass and no thyromegaly present.  Cardiovascular: Normal rate, regular rhythm, normal heart sounds and normal pulses.   Pulmonary/Chest: Effort normal and breath sounds normal. She has no wheezes. She has no rhonchi. She has no rales. Right breast exhibits no inverted nipple, no mass, no nipple discharge, no skin change and no tenderness. Left breast exhibits no inverted nipple, no mass, no nipple discharge, no skin change and no tenderness. Breasts are symmetrical.  Musculoskeletal:  Midline lumbar scar noted.   Neurological: She is alert.  Skin: Skin is warm and dry.  Psychiatric: She has a normal mood and affect. Her speech is normal and behavior is normal. Thought content normal.  Vitals reviewed.      Assessment & Plan:   Problem List Items Addressed This Visit      Other   Low back pain    Stable.Chronic. History of a laminectomy. No red flag signs. No sciatic symptoms. Doing well on Flexeril PRN. Refilled medication.      Relevant Medications   cyclobenzaprine (FLEXERIL) 5 MG tablet   Routine general medical examination at a health care facility - Primary    Patient decided to cologuard as opposed to taking off work for colonoscopy. She is up-to-date on her mammogram. She no longer screen for cervical cancer due to hysterectomy. She will send [place] records of her tetanus which she believes she's had for work. Screening labs today with addition of hepatitis C and HIV screening labs.      Relevant Orders   CBC with Differential/Platelet   Comprehensive metabolic panel   Hemoglobin A1c   Hepatitis C antibody   HIV antibody   Lipid panel   TSH   VITAMIN D 25 Hydroxy (Vit-D Deficiency, Fractures)   Adjustment disorder with mixed anxiety and depressed mood    Primarily anxiety. Stable and well controlled. She is using her Xanax as prescribed and appropriately.      Relevant Medications   ALPRAZolam (XANAX) 0.25 MG tablet    Other Visit Diagnoses   None.       I have discontinued Ms. Moseley's predniSONE. I am also having her maintain her acetaminophen, meclizine, ondansetron, ALPRAZolam, and cyclobenzaprine.   Meds ordered this encounter  Medications  . ALPRAZolam (XANAX) 0.25 MG tablet    Sig: Take 1 tablet (0.25 mg total) by mouth 3 (three) times daily as needed for anxiety.    Dispense:  90 tablet    Refill:  0    Order Specific Question:   Supervising Provider    Answer:   Deborra Medina L [[id number]]  . cyclobenzaprine (FLEXERIL) 5 MG tablet    Sig: Take 1 tablet (5 mg total) by mouth 3 (three) times daily as needed  for muscle spasms.    Dispense:  30 tablet    Refill:  0    Order Specific Question:   Supervising Provider    Answer:   Crecencio Mc [[id number]]    Return precautions given.   Risks, benefits, and alternatives of the medications and treatment plan prescribed today were discussed, and patient expressed understanding.   Education regarding symptom management and diagnosis given to patient on AVS.   Continue to follow with Rica Mast, MD for routine health maintenance.   Heather Pope and I agreed with plan.   Mable Paris, FNP

## 2016-05-16 NOTE — Assessment & Plan Note (Signed)
Stable.Chronic. History of a laminectomy. No red flag signs. No sciatic symptoms. Doing well on Flexeril PRN. Refilled medication.

## 2016-05-16 NOTE — Telephone Encounter (Signed)
Informed patient she needs to come back for signature on cologuard order form.  Will place in on Hold folder on desk awaiting signature.  Patient stated she will come sign in Friday.

## 2016-05-16 NOTE — Assessment & Plan Note (Signed)
Patient decided to cologuard as opposed to taking off work for colonoscopy. She is up-to-date on her mammogram. She no longer screen for cervical cancer due to hysterectomy. She will send [place] records of her tetanus which she believes she's had for work. Screening labs today with addition of hepatitis C and HIV screening labs.

## 2016-05-17 ENCOUNTER — Encounter: Payer: Self-pay | Admitting: Family

## 2016-05-17 DIAGNOSIS — E559 Vitamin D deficiency, unspecified: Secondary | ICD-10-CM | POA: Insufficient documentation

## 2016-05-17 LAB — HIV ANTIBODY (ROUTINE TESTING W REFLEX): HIV 1&2 Ab, 4th Generation: NONREACTIVE

## 2016-05-17 LAB — HEPATITIS C ANTIBODY: HCV AB: NEGATIVE

## 2016-05-27 ENCOUNTER — Encounter: Payer: Self-pay | Admitting: Family

## 2016-06-06 DIAGNOSIS — H5213 Myopia, bilateral: Secondary | ICD-10-CM | POA: Diagnosis not present

## 2016-06-22 ENCOUNTER — Ambulatory Visit: Payer: Self-pay | Admitting: Family Medicine

## 2016-07-10 ENCOUNTER — Ambulatory Visit (INDEPENDENT_AMBULATORY_CARE_PROVIDER_SITE_OTHER): Payer: 59 | Admitting: Surgical

## 2016-07-10 DIAGNOSIS — Z23 Encounter for immunization: Secondary | ICD-10-CM | POA: Diagnosis not present

## 2016-08-29 ENCOUNTER — Ambulatory Visit
Admission: EM | Admit: 2016-08-29 | Discharge: 2016-08-29 | Disposition: A | Discharge status: Home / Self Care | Payer: 59 | Attending: Emergency Medicine | Admitting: Emergency Medicine

## 2016-08-29 ENCOUNTER — Encounter: Payer: Self-pay | Admitting: *Deleted

## 2016-08-29 DIAGNOSIS — J069 Acute upper respiratory infection, unspecified: Secondary | ICD-10-CM | POA: Diagnosis not present

## 2016-08-29 LAB — RAPID STREP SCREEN (MED CTR MEBANE ONLY): STREPTOCOCCUS, GROUP A SCREEN (DIRECT): NEGATIVE

## 2016-08-29 NOTE — ED Provider Notes (Signed)
CSN: [id number]     Arrival date & time 08/29/16  [id number] History   First MD Initiated Contact with Patient 08/29/16 1023     Chief Complaint  Patient presents with  . Sore Throat  . Cough  . Chills   (Consider location/radiation/quality/duration/timing/severity/associated sxs/prior Treatment) Patient is a healthy 50 y.o female with insignificant medical history, presents today for 2-day sudden onset of coughing, sore throat (R side worst than left), chills, fever, and congestion. She started taking delsym and alka seltzer plus 2 days ago with little improvement. She works in [employer] and have had sick encounters. Patient denies sinus pain, running nose, ear pain, shortness of breath or chest pain.       Past Medical History:  Diagnosis Date  . Endometriosis    Past Surgical History:  Procedure Laterality Date  . ABDOMINAL HYSTERECTOMY     Dr. Vernie Ammons, one ovary in place  . BREAST SURGERY     Dr. Koleen Nimrod, normal  . SPINAL FUSION    . SPINE SURGERY  2011   L4-5, S1 fusion, Dr. Mauri Pole  . VAGINAL DELIVERY     3   Family History  Problem Relation Age of Onset  . Heart disease Mother   . Diabetes Mother   . Heart disease Maternal Grandmother   . Heart disease Maternal Grandfather     CABG  . Multiple sclerosis Daughter   . Cancer Maternal Aunt     brain  . Breast cancer Neg Hx    Social History  Substance Use Topics  . Smoking status: Current Every Day Smoker    Packs/day: 0.50    Years: 35.00    Types: Cigarettes  . Smokeless tobacco: Never Used  . Alcohol use No   OB History    No data available     Review of Systems  Constitutional: Positive for chills, fatigue and fever.  HENT: Positive for congestion and sore throat. Negative for ear pain, rhinorrhea, sinus pain, sinus pressure and sneezing.   Respiratory: Positive for cough. Negative for shortness of breath.   Cardiovascular: Negative for chest pain, palpitations and leg swelling.  Gastrointestinal:  Negative for abdominal pain, diarrhea, nausea and vomiting.  Neurological: Negative for headaches.    Allergies  Levaquin [levofloxacin in d5w]; Oxycodone; and Penicillins  Home Medications   Prior to Admission medications   Medication Sig Start Date End Date Taking? Authorizing Provider  acetaminophen (TYLENOL) 500 MG tablet Take 500 mg by mouth every 6 (six) hours as needed. 2 tablets three times daily    Yes Historical Provider, MD  ALPRAZolam (XANAX) 0.25 MG tablet Take 1 tablet (0.25 mg total) by mouth 3 (three) times daily as needed for anxiety. 05/16/16  Yes Burnard Hawthorne, FNP  cyclobenzaprine (FLEXERIL) 5 MG tablet Take 1 tablet (5 mg total) by mouth 3 (three) times daily as needed for muscle spasms. 05/16/16  Yes Burnard Hawthorne, FNP  meclizine (ANTIVERT) 25 MG tablet Take 1 tablet (25 mg total) by mouth 3 (three) times daily as needed for dizziness. 11/21/15   Versie Starks, PA-C  ondansetron (ZOFRAN) 8 MG tablet Take 1 tablet (8 mg total) by mouth 3 (three) times daily as needed for nausea or vomiting. 11/30/15   Jackolyn Confer, MD   Meds Ordered and Administered this Visit  Medications - No data to display  BP 119/80 (BP Location: Left Arm)   Pulse 80   Temp 98.2 F (36.8 C) (Oral)   Resp 16  Ht 5\' 9"  (1.753 m)   Wt 190 lb (86.2 kg)   SpO2 100%   BMI 28.06 kg/m  No data found.   Physical Exam  Constitutional: She is oriented to person, place, and time. She appears well-developed and well-nourished.  HENT:  Head: Normocephalic and atraumatic.  Right Ear: External ear normal.  Nose: Nose normal.  Mouth/Throat: Oropharynx is clear and moist. No oropharyngeal exudate.  non-infected fluid in the middle ear space noted bilaterally. TM pearly gray with no erythema  Eyes: Conjunctivae and EOM are normal. Pupils are equal, round, and reactive to light. Right eye exhibits no discharge. Left eye exhibits no discharge.  Neck: Normal range of motion. Neck supple.   Cardiovascular: Normal rate, regular rhythm and normal heart sounds.   No murmur heard. Pulmonary/Chest: Effort normal and breath sounds normal. No respiratory distress. She has no wheezes.  Abdominal: Soft. Bowel sounds are normal. She exhibits no distension. There is no tenderness.  Lymphadenopathy:    She has no cervical adenopathy.  Neurological: She is alert and oriented to person, place, and time.  Skin: Skin is warm and dry.  Psychiatric: She has a normal mood and affect.  Nursing note and vitals reviewed.   Urgent Care Course   Clinical Course     Procedures (including critical care time)  Labs Review Labs Reviewed  RAPID STREP SCREEN (NOT AT [facility])  CULTURE, GROUP A STREP [facility])    Imaging Review No results found.  MDM   1. Upper respiratory tract infection, unspecified type    Rapid strep negative. Symptoms most consistent with URI. No abx indicated at this time. Encouraged good hand hygiene, hydration, rest. Prescriptive cough syrup offered but patient declined; reporting that delsym is helping. May do salt water gargle, honey, throat lozenges for sore throat. Informed to return if symptoms get worst.    Barry Dienes, NP 08/29/16 1113

## 2016-08-29 NOTE — ED Triage Notes (Signed)
Pt c/o sore throat, chills, productive cough- yellow, since yesterday.

## 2016-08-31 LAB — CULTURE, GROUP A STREP (THRC)

## 2017-02-05 ENCOUNTER — Encounter: Payer: Self-pay | Admitting: Physician Assistant

## 2017-02-05 ENCOUNTER — Ambulatory Visit: Payer: Self-pay | Admitting: Physician Assistant

## 2017-02-05 VITALS — BP 114/80 | HR 64 | Temp 98.2°F

## 2017-02-05 DIAGNOSIS — M549 Dorsalgia, unspecified: Secondary | ICD-10-CM

## 2017-02-05 MED ORDER — CYCLOBENZAPRINE HCL 5 MG PO TABS: 5.0000 mg | ORAL_TABLET | Freq: Three times a day (TID) | ORAL | 0 refills | Status: DC | PRN

## 2017-02-05 MED ORDER — METHYLPREDNISOLONE 4 MG PO TBPK: ORAL_TABLET | ORAL | 0 refills | Status: DC

## 2017-02-05 MED ORDER — CYCLOBENZAPRINE HCL 10 MG PO TABS: 10.0000 mg | ORAL_TABLET | Freq: Three times a day (TID) | ORAL | 0 refills | Status: DC | PRN

## 2017-02-05 NOTE — Progress Notes (Signed)
S: c/o upper back and neck pain, states its been flared up for a couple of weeks, no known injury, hx of lumbar surgery, using ice which has helped, last time it got like this she needed steroids. Denies numbness or tingling  O: vitals wnl, nad, spine is tender in thoracic area, trapezious and supraspinatus muscles are spasmed b/l, grips = b/l, lungs  c t a, cv rrr  A: acute upper  Back pain with muscle spasms  P: medrol dose pack, flexeril , ice, stretches, if not better in a week will refer to PT

## 2017-04-04 ENCOUNTER — Encounter: Payer: Self-pay | Admitting: Physician Assistant

## 2017-04-04 ENCOUNTER — Ambulatory Visit: Payer: Self-pay | Admitting: Physician Assistant

## 2017-04-04 VITALS — BP 102/80 | HR 76 | Temp 98.7°F

## 2017-04-04 DIAGNOSIS — L989 Disorder of the skin and subcutaneous tissue, unspecified: Secondary | ICD-10-CM

## 2017-04-04 MED ORDER — ERYTHROMYCIN 5 MG/GM OP OINT: TOPICAL_OINTMENT | OPHTHALMIC | 0 refills | Status: DC

## 2017-04-04 NOTE — Progress Notes (Signed)
S: c/o small bump below left eye on area near her nose, no drainage, no redness or pus, been using warm compress for several days, no relief, area is sore and feels bruised  O: vitals wnl, nad, skin below eye with hard cyst-like lesion, no drainage, no eye redness, area is not on lash line or conjunctiva, n/v itnact  A: skin lesion  P: e-mycin opth ointment, f/u with eye doctor

## 2017-04-05 DIAGNOSIS — H02826 Cysts of left eye, unspecified eyelid: Secondary | ICD-10-CM | POA: Diagnosis not present

## 2017-06-27 ENCOUNTER — Other Ambulatory Visit: Payer: Self-pay | Admitting: Family

## 2017-06-27 DIAGNOSIS — Z1231 Encounter for screening mammogram for malignant neoplasm of breast: Secondary | ICD-10-CM

## 2017-07-01 ENCOUNTER — Ambulatory Visit
Admission: RE | Admit: 2017-07-01 | Discharge: 2017-07-01 | Disposition: A | Discharge status: Home / Self Care | Payer: 59 | Source: Ambulatory Visit | Attending: Family | Admitting: Family

## 2017-07-01 DIAGNOSIS — Z1231 Encounter for screening mammogram for malignant neoplasm of breast: Secondary | ICD-10-CM | POA: Diagnosis not present

## 2017-07-22 ENCOUNTER — Ambulatory Visit (INDEPENDENT_AMBULATORY_CARE_PROVIDER_SITE_OTHER): Payer: 59 | Admitting: Family

## 2017-07-22 ENCOUNTER — Encounter: Payer: Self-pay | Admitting: Family

## 2017-07-22 VITALS — BP 122/68 | HR 65 | Temp 98.1°F | Ht 69.0 in | Wt 198.6 lb

## 2017-07-22 DIAGNOSIS — Z0000 Encounter for general adult medical examination without abnormal findings: Secondary | ICD-10-CM | POA: Diagnosis not present

## 2017-07-22 DIAGNOSIS — Z23 Encounter for immunization: Secondary | ICD-10-CM | POA: Diagnosis not present

## 2017-07-22 DIAGNOSIS — F4323 Adjustment disorder with mixed anxiety and depressed mood: Secondary | ICD-10-CM | POA: Diagnosis not present

## 2017-07-22 DIAGNOSIS — M545 Low back pain: Secondary | ICD-10-CM | POA: Diagnosis not present

## 2017-07-22 DIAGNOSIS — G8929 Other chronic pain: Secondary | ICD-10-CM

## 2017-07-22 MED ORDER — CYCLOBENZAPRINE HCL 5 MG PO TABS: 5.0000 mg | ORAL_TABLET | Freq: Two times a day (BID) | ORAL | 1 refills | Status: DC

## 2017-07-22 MED ORDER — ALPRAZOLAM 0.25 MG PO TABS: 0.2500 mg | ORAL_TABLET | Freq: Three times a day (TID) | ORAL | 0 refills | Status: DC | PRN

## 2017-07-22 NOTE — Progress Notes (Signed)
Subjective:    Patient ID: Heather Pope, female    DOB: [birth year], 51 y.o.   MRN: [id number]  CC: Heather Pope is a 51 y.o. female who presents today for physical exam.    HPI: Anxiety- uses xanax very rarely for panic attacks. No depression.   H/o chronic low back, had back surgery; uses flexeril after long shift, approx once per week. Uses ice, tyleonol for pain relife.     Colorectal Cancer Screening: due; plans to do cologuard and send in this week; 'still has order and it is active.'  Breast Cancer Screening: Mammogram UTD; declines breast exam today.  Cervical Cancer Screening: H/o hysterectomy Bone Health screening/DEXA for 65+: No increased fracture risk. Defer screening at this time. Lung Cancer Screening: Doesn't have 30 year pack year history and age > 50 years.       Tetanus - utd        Labs: Screening labs today. Exercise: Gets regular exercise.  Alcohol use: none Smoking/tobacco use: smoker.  Regular dental exams:UTD Wears seat belt: Yes. Skin: no h/o skin cancer. No new lesions.   HISTORY:  Past Medical History:  Diagnosis Date  . Endometriosis     Past Surgical History:  Procedure Laterality Date  . ABDOMINAL HYSTERECTOMY     Dr. Vernie Ammons, one ovary in place  . BREAST SURGERY     Dr. Koleen Nimrod, normal  . SPINAL FUSION    . SPINE SURGERY  2011   L4-5, S1 fusion, Dr. Mauri Pole  . VAGINAL DELIVERY     3   Family History  Problem Relation Age of Onset  . Heart disease Mother   . Diabetes Mother   . Heart disease Maternal Grandmother   . Heart disease Maternal Grandfather        CABG  . Multiple sclerosis Daughter   . Cancer Maternal Aunt        brain  . Breast cancer Neg Hx       ALLERGIES: Ibuprofen; Levaquin [levofloxacin in d5w]; Oxycodone; and Penicillins  Current Outpatient Prescriptions on File Prior to Visit  Medication Sig Dispense Refill  . acetaminophen (TYLENOL) 500 MG tablet Take 500 mg by mouth every 6  (six) hours as needed. 2 tablets three times daily      No current facility-administered medications on file prior to visit.     Social History  Substance Use Topics  . Smoking status: Current Every Day Smoker    Packs/day: 0.50    Years: 35.00    Types: Cigarettes  . Smokeless tobacco: Never Used  . Alcohol use No    Review of Systems  Constitutional: Negative for chills, fever and unexpected weight change.  HENT: Negative for congestion.   Respiratory: Negative for cough.   Cardiovascular: Negative for chest pain, palpitations and leg swelling.  Gastrointestinal: Negative for nausea and vomiting.  Genitourinary: Negative for pelvic pain and vaginal bleeding.  Musculoskeletal: Negative for arthralgias and myalgias.  Skin: Negative for rash.  Neurological: Negative for headaches.  Hematological: Negative for adenopathy.  Psychiatric/Behavioral: Negative for confusion.      Objective:    BP 122/68   Pulse 65   Temp 98.1 F (36.7 C) (Oral)   Ht 5\' 9"  (1.753 m)   Wt 198 lb 9.6 oz (90.1 kg)   SpO2 99%   BMI 29.33 kg/m   BP Readings from Last 3 Encounters:  07/22/17 122/68  04/04/17 102/80  02/05/17 114/80   Wt Readings from Last 3  Encounters:  07/22/17 198 lb 9.6 oz (90.1 kg)  08/29/16 190 lb (86.2 kg)  05/16/16 196 lb 6.4 oz (89.1 kg)    Physical Exam  Constitutional: She appears well-developed and well-nourished.  Eyes: Conjunctivae are normal.  Neck: No thyroid mass and no thyromegaly present.  Cardiovascular: Normal rate, regular rhythm, normal heart sounds and normal pulses.   Pulmonary/Chest: Effort normal and breath sounds normal. She has no wheezes. She has no rhonchi. She has no rales.  Lymphadenopathy:       Head (right side): No submental, no submandibular, no tonsillar, no preauricular, no posterior auricular and no occipital adenopathy present.       Head (left side): No submental, no submandibular, no tonsillar, no preauricular, no posterior  auricular and no occipital adenopathy present.    She has no cervical adenopathy.  Neurological: She is alert.  Skin: Skin is warm and dry.  Psychiatric: She has a normal mood and affect. Her speech is normal and behavior is normal. Thought content normal.  Vitals reviewed.      Assessment & Plan:   Problem List Items Addressed This Visit      Other   Low back pain - Primary    Controlled on prn flexeril      Relevant Medications   cyclobenzaprine (FLEXERIL) 5 MG tablet   Routine general medical examination at a health care facility    Pending cologuard. Reemphasized the importance of this. Declines breast exam today.Mammogram up-to-date. History of hysterectomy; in the absence of no vaginal pain or complaints, patient also declines pelvic exam. Screening labs ordered.      Adjustment disorder with mixed anxiety and depressed mood    Stable. Using very sparingly. Refilled  I looked up patient on [place] Controlled Substances Reporting System and saw no activity that raised concern of inappropriate use.        Relevant Medications   ALPRAZolam (XANAX) 0.25 MG tablet    Other Visit Diagnoses    Need for immunization against influenza       Relevant Orders   Flu Vaccine QUAD 36+ mos IM (Completed)       I have discontinued Heather Pope's meclizine, ondansetron, methylPREDNISolone, cyclobenzaprine, and erythromycin. I am also having her start on cyclobenzaprine. Additionally, I am having her maintain her acetaminophen and ALPRAZolam.   Meds ordered this encounter  Medications  . ALPRAZolam (XANAX) 0.25 MG tablet    Sig: Take 1 tablet (0.25 mg total) by mouth 3 (three) times daily as needed for anxiety.    Dispense:  90 tablet    Refill:  0    Order Specific Question:   Supervising Provider    Answer:   Deborra Medina L [[id number]]  . cyclobenzaprine (FLEXERIL) 5 MG tablet    Sig: Take 1 tablet (5 mg total) by mouth 2 (two) times daily.    Dispense:  60 tablet    Refill:  1     Order Specific Question:   Supervising Provider    Answer:   Crecencio Mc [[id number]]    Return precautions given.   Risks, benefits, and alternatives of the medications and treatment plan prescribed today were discussed, and patient expressed understanding.   Education regarding symptom management and diagnosis given to patient on AVS.   Continue to follow with Burnard Hawthorne, FNP for routine health maintenance.   Heather Pope and I agreed with plan.   Mable Paris, FNP

## 2017-07-22 NOTE — Assessment & Plan Note (Signed)
Controlled on prn flexeril

## 2017-07-22 NOTE — Assessment & Plan Note (Signed)
Pending cologuard. Reemphasized the importance of this. Declines breast exam today.Mammogram up-to-date. History of hysterectomy; in the absence of no vaginal pain or complaints, patient also declines pelvic exam. Screening labs ordered.

## 2017-07-22 NOTE — Assessment & Plan Note (Signed)
Stable. Using very sparingly. Refilled  I looked up patient on [place] Controlled Substances Reporting System and saw no activity that raised concern of inappropriate use.

## 2017-07-22 NOTE — Patient Instructions (Addendum)
Future labs when fasting  Great to see you!  Health Maintenance, Female Adopting a healthy lifestyle and getting preventive care can go a long way to promote health and wellness. Talk with your health care provider about what schedule of regular examinations is right for you. This is a good chance for you to check in with your provider about disease prevention and staying healthy. In between checkups, there are plenty of things you can do on your own. Experts have done a lot of research about which lifestyle changes and preventive measures are most likely to keep you healthy. Ask your health care provider for more information. Weight and diet Eat a healthy diet  Be sure to include plenty of vegetables, fruits, low-fat dairy products, and lean protein.  Do not eat a lot of foods high in solid fats, added sugars, or salt.  Get regular exercise. This is one of the most important things you can do for your health. ? Most adults should exercise for at least 150 minutes each week. The exercise should increase your heart rate and make you sweat (moderate-intensity exercise). ? Most adults should also do strengthening exercises at least twice a week. This is in addition to the moderate-intensity exercise.  Maintain a healthy weight  Body mass index (BMI) is a measurement that can be used to identify possible weight problems. It estimates body fat based on height and weight. Your health care provider can help determine your BMI and help you achieve or maintain a healthy weight.  For females 73 years of age and older: ? A BMI below 18.5 is considered underweight. ? A BMI of 18.5 to 24.9 is normal. ? A BMI of 25 to 29.9 is considered overweight. ? A BMI of 30 and above is considered obese.  Watch levels of cholesterol and blood lipids  You should start having your blood tested for lipids and cholesterol at 53 years of age, then have this test every 5 years.  You may need to have your cholesterol  levels checked more often if: ? Your lipid or cholesterol levels are high. ? You are older than 53 years of age. ? You are at high risk for heart disease.  Cancer screening Lung Cancer  Lung cancer screening is recommended for adults 10-74 years old who are at high risk for lung cancer because of a history of smoking.  A yearly low-dose CT scan of the lungs is recommended for people who: ? Currently smoke. ? Have quit within the past 15 years. ? Have at least a 30-pack-year history of smoking. A pack year is smoking an average of one pack of cigarettes a day for 1 year.  Yearly screening should continue until it has been 15 years since you quit.  Yearly screening should stop if you develop a health problem that would prevent you from having lung cancer treatment.  Breast Cancer  Practice breast self-awareness. This means understanding how your breasts normally appear and feel.  It also means doing regular breast self-exams. Let your health care provider know about any changes, no matter how small.  If you are in your 20s or 30s, you should have a clinical breast exam (CBE) by a health care provider every 1-3 years as part of a regular health exam.  If you are 79 or older, have a CBE every year. Also consider having a breast X-ray (mammogram) every year.  If you have a family history of breast cancer, talk to your health care provider  provider about genetic screening.  If you are at high risk for breast cancer, talk to your health care provider about having an MRI and a mammogram every year.  Breast cancer gene (BRCA) assessment is recommended for women who have family members with BRCA-related cancers. BRCA-related cancers include: ? Breast. ? Ovarian. ? Tubal. ? Peritoneal cancers.  Results of the assessment will determine the need for genetic counseling and BRCA1 and BRCA2 testing.  Cervical Cancer Your health care provider may recommend that you be screened regularly for cancer of  the pelvic organs (ovaries, uterus, and vagina). This screening involves a pelvic examination, including checking for microscopic changes to the surface of your cervix (Pap test). You may be encouraged to have this screening done every 3 years, beginning at age 21.  For women ages 30-65, health care providers may recommend pelvic exams and Pap testing every 3 years, or they may recommend the Pap and pelvic exam, combined with testing for human papilloma virus (HPV), every 5 years. Some types of HPV increase your risk of cervical cancer. Testing for HPV may also be done on women of any age with unclear Pap test results.  Other health care providers may not recommend any screening for nonpregnant women who are considered low risk for pelvic cancer and who do not have symptoms. Ask your health care provider if a screening pelvic exam is right for you.  If you have had past treatment for cervical cancer or a condition that could lead to cancer, you need Pap tests and screening for cancer for at least 20 years after your treatment. If Pap tests have been discontinued, your risk factors (such as having a new sexual partner) need to be reassessed to determine if screening should resume. Some women have medical problems that increase the chance of getting cervical cancer. In these cases, your health care provider may recommend more frequent screening and Pap tests.  Colorectal Cancer  This type of cancer can be detected and often prevented.  Routine colorectal cancer screening usually begins at 53 years of age and continues through 53 years of age.  Your health care provider may recommend screening at an earlier age if you have risk factors for colon cancer.  Your health care provider may also recommend using home test kits to check for hidden blood in the stool.  A small camera at the end of a tube can be used to examine your colon directly (sigmoidoscopy or colonoscopy). This is done to check for the  earliest forms of colorectal cancer.  Routine screening usually begins at age 50.  Direct examination of the colon should be repeated every 5-10 years through 53 years of age. However, you may need to be screened more often if early forms of precancerous polyps or small growths are found.  Skin Cancer  Check your skin from head to toe regularly.  Tell your health care provider about any new moles or changes in moles, especially if there is a change in a mole's shape or color.  Also tell your health care provider if you have a mole that is larger than the size of a pencil eraser.  Always use sunscreen. Apply sunscreen liberally and repeatedly throughout the day.  Protect yourself by wearing long sleeves, pants, a wide-brimmed hat, and sunglasses whenever you are outside.  Heart disease, diabetes, and high blood pressure  High blood pressure causes heart disease and increases the risk of stroke. High blood pressure is more likely to develop in: ?   People who have blood pressure in the high end of the normal range (130-139/85-89 mm Hg). ? People who are overweight or obese. ? People who are African American.  If you are 18-39 years of age, have your blood pressure checked every 3-5 years. If you are 40 years of age or older, have your blood pressure checked every year. You should have your blood pressure measured twice-once when you are at a hospital or clinic, and once when you are not at a hospital or clinic. Record the average of the two measurements. To check your blood pressure when you are not at a hospital or clinic, you can use: ? An automated blood pressure machine at a pharmacy. ? A home blood pressure monitor.  If you are between 55 years and 79 years old, ask your health care provider if you should take aspirin to prevent strokes.  Have regular diabetes screenings. This involves taking a blood sample to check your fasting blood sugar level. ? If you are at a normal weight and  have a low risk for diabetes, have this test once every three years after 53 years of age. ? If you are overweight and have a high risk for diabetes, consider being tested at a younger age or more often. Preventing infection Hepatitis B  If you have a higher risk for hepatitis B, you should be screened for this virus. You are considered at high risk for hepatitis B if: ? You were born in a country where hepatitis B is common. Ask your health care provider which countries are considered high risk. ? Your parents were born in a high-risk country, and you have not been immunized against hepatitis B (hepatitis B vaccine). ? You have HIV or AIDS. ? You use needles to inject street drugs. ? You live with someone who has hepatitis B. ? You have had sex with someone who has hepatitis B. ? You get hemodialysis treatment. ? You take certain medicines for conditions, including cancer, organ transplantation, and autoimmune conditions.  Hepatitis C  Blood testing is recommended for: ? Everyone born from 1945 through 1965. ? Anyone with known risk factors for hepatitis C.  Sexually transmitted infections (STIs)  You should be screened for sexually transmitted infections (STIs) including gonorrhea and chlamydia if: ? You are sexually active and are younger than 53 years of age. ? You are older than 53 years of age and your health care provider tells you that you are at risk for this type of infection. ? Your sexual activity has changed since you were last screened and you are at an increased risk for chlamydia or gonorrhea. Ask your health care provider if you are at risk.  If you do not have HIV, but are at risk, it may be recommended that you take a prescription medicine daily to prevent HIV infection. This is called pre-exposure prophylaxis (PrEP). You are considered at risk if: ? You are sexually active and do not regularly use condoms or know the HIV status of your partner(s). ? You take drugs by  injection. ? You are sexually active with a partner who has HIV.  Talk with your health care provider about whether you are at high risk of being infected with HIV. If you choose to begin PrEP, you should first be tested for HIV. You should then be tested every 3 months for as long as you are taking PrEP. Pregnancy  If you are premenopausal and you may become pregnant, ask your health   care provider about preconception counseling.  If you may become pregnant, take 400 to 800 micrograms (mcg) of folic acid every day.  If you want to prevent pregnancy, talk to your health care provider about birth control (contraception). Osteoporosis and menopause  Osteoporosis is a disease in which the bones lose minerals and strength with aging. This can result in serious bone fractures. Your risk for osteoporosis can be identified using a bone density scan.  If you are 38 years of age or older, or if you are at risk for osteoporosis and fractures, ask your health care provider if you should be screened.  Ask your health care provider whether you should take a calcium or vitamin D supplement to lower your risk for osteoporosis.  Menopause may have certain physical symptoms and risks.  Hormone replacement therapy may reduce some of these symptoms and risks. Talk to your health care provider about whether hormone replacement therapy is right for you. Follow these instructions at home:  Schedule regular health, dental, and eye exams.  Stay current with your immunizations.  Do not use any tobacco products including cigarettes, chewing tobacco, or electronic cigarettes.  If you are pregnant, do not drink alcohol.  If you are breastfeeding, limit how much and how often you drink alcohol.  Limit alcohol intake to no more than 1 drink per day for nonpregnant women. One drink equals 12 ounces of beer, 5 ounces of wine, or 1 ounces of hard liquor.  Do not use street drugs.  Do not share needles.  Ask  your health care provider for help if you need support or information about quitting drugs.  Tell your health care provider if you often feel depressed.  Tell your health care provider if you have ever been abused or do not feel safe at home. This information is not intended to replace advice given to you by your health care provider. Make sure you discuss any questions you have with your health care provider. Document Released: 04/09/2011 Document Revised: 03/01/2016 Document Reviewed: 06/28/2015 Elsevier Interactive Patient Education  [employer].

## 2017-07-22 NOTE — Progress Notes (Signed)
Pre visit review using our clinic review tool, if applicable. No additional management support is needed unless otherwise documented below in the visit note. 

## 2017-07-23 DIAGNOSIS — H5213 Myopia, bilateral: Secondary | ICD-10-CM | POA: Diagnosis not present

## 2017-07-24 ENCOUNTER — Telehealth: Payer: Self-pay | Admitting: Family

## 2017-07-24 NOTE — Telephone Encounter (Signed)
Called [facility] and they will call [facility] and get script for flexeril transferred to them.

## 2017-07-24 NOTE — Telephone Encounter (Signed)
Pt needs rx on cyclobenzaprine (FLEXERIL) 5 MG tablet sent to [facility] not [facility]

## 2017-07-31 ENCOUNTER — Other Ambulatory Visit (INDEPENDENT_AMBULATORY_CARE_PROVIDER_SITE_OTHER): Payer: 59

## 2017-07-31 DIAGNOSIS — Z0000 Encounter for general adult medical examination without abnormal findings: Secondary | ICD-10-CM

## 2017-07-31 LAB — CBC WITH DIFFERENTIAL/PLATELET
BASOS ABS: 0.1 10*3/uL (ref 0.0–0.1)
Basophils Relative: 0.8 % (ref 0.0–3.0)
EOS PCT: 2.8 % (ref 0.0–5.0)
Eosinophils Absolute: 0.2 10*3/uL (ref 0.0–0.7)
HEMATOCRIT: 43.5 % (ref 36.0–46.0)
Hemoglobin: 14.4 g/dL (ref 12.0–15.0)
LYMPHS ABS: 2 10*3/uL (ref 0.7–4.0)
LYMPHS PCT: 32.3 % (ref 12.0–46.0)
MCHC: 33.1 g/dL (ref 30.0–36.0)
MCV: 88.6 fl (ref 78.0–100.0)
MONOS PCT: 7.1 % (ref 3.0–12.0)
Monocytes Absolute: 0.4 10*3/uL (ref 0.1–1.0)
NEUTROS ABS: 3.6 10*3/uL (ref 1.4–7.7)
Neutrophils Relative %: 57 % (ref 43.0–77.0)
PLATELETS: 264 10*3/uL (ref 150.0–400.0)
RBC: 4.91 Mil/uL (ref 3.87–5.11)
RDW: 13.9 % (ref 11.5–15.5)
WBC: 6.3 10*3/uL (ref 4.0–10.5)

## 2017-07-31 LAB — COMPREHENSIVE METABOLIC PANEL
ALBUMIN: 4.5 g/dL (ref 3.5–5.2)
ALK PHOS: 57 U/L (ref 39–117)
ALT: 9 U/L (ref 0–35)
AST: 14 U/L (ref 0–37)
BUN: 14 mg/dL (ref 6–23)
CALCIUM: 9.9 mg/dL (ref 8.4–10.5)
CO2: 29 mEq/L (ref 19–32)
Chloride: 104 mEq/L (ref 96–112)
Creatinine, Ser: 0.77 mg/dL (ref 0.40–1.20)
GFR: 83.31 mL/min (ref >60.00)
Glucose, Bld: 91 mg/dL (ref 70–99)
POTASSIUM: 4.7 meq/L (ref 3.5–5.1)
Sodium: 141 mEq/L (ref 135–145)
TOTAL PROTEIN: 6.8 g/dL (ref 6.0–8.3)
Total Bilirubin: 0.6 mg/dL (ref 0.2–1.2)

## 2017-07-31 LAB — LIPID PANEL
CHOLESTEROL: 177 mg/dL (ref 0–200)
HDL: 63.7 mg/dL (ref >39.00)
LDL CALC: 95 mg/dL (ref 0–99)
NonHDL: 112.82
TRIGLYCERIDES: 89 mg/dL (ref 0.0–149.0)
Total CHOL/HDL Ratio: 3
VLDL: 17.8 mg/dL (ref 0.0–40.0)

## 2017-07-31 LAB — HEMOGLOBIN A1C: Hgb A1c MFr Bld: 5.6 % (ref 4.6–6.5)

## 2017-07-31 LAB — TSH: TSH: 2.68 u[IU]/mL (ref 0.35–4.50)

## 2017-07-31 LAB — VITAMIN D 25 HYDROXY (VIT D DEFICIENCY, FRACTURES): VITD: 9.15 ng/mL — AB (ref 30.00–100.00)

## 2017-08-01 ENCOUNTER — Telehealth: Payer: Self-pay | Admitting: Family

## 2017-08-01 NOTE — Telephone Encounter (Signed)
Results have not been reviewed yet 

## 2017-08-01 NOTE — Telephone Encounter (Signed)
Pt called requesting lab results. Please advise, thank you!  Call pt @ [phone]

## 2017-08-01 NOTE — Telephone Encounter (Signed)
Call pt  Results released on mychart yesterday

## 2017-08-05 NOTE — Telephone Encounter (Signed)
Patient viewed on mychart per chart

## 2017-08-24 ENCOUNTER — Other Ambulatory Visit: Payer: Self-pay

## 2017-08-24 ENCOUNTER — Encounter: Payer: Self-pay | Admitting: Emergency Medicine

## 2017-08-24 ENCOUNTER — Ambulatory Visit
Admission: EM | Admit: 2017-08-24 | Discharge: 2017-08-24 | Disposition: A | Discharge status: Home / Self Care | Payer: 59 | Attending: Emergency Medicine | Admitting: Emergency Medicine

## 2017-08-24 DIAGNOSIS — H6983 Other specified disorders of Eustachian tube, bilateral: Secondary | ICD-10-CM

## 2017-08-24 DIAGNOSIS — J069 Acute upper respiratory infection, unspecified: Secondary | ICD-10-CM | POA: Diagnosis not present

## 2017-08-24 MED ORDER — ALBUTEROL SULFATE HFA 108 (90 BASE) MCG/ACT IN AERS: 1.0000 | INHALATION_SPRAY | Freq: Four times a day (QID) | RESPIRATORY_TRACT | 0 refills | Status: DC | PRN

## 2017-08-24 MED ORDER — BENZONATATE 200 MG PO CAPS: ORAL_CAPSULE | ORAL | 0 refills | Status: DC

## 2017-08-24 MED ORDER — FLUTICASONE PROPIONATE 50 MCG/ACT NA SUSP: 2.0000 | Freq: Every day | NASAL | 0 refills | Status: DC

## 2017-08-24 NOTE — ED Triage Notes (Signed)
Patient c/o cough and chest congestion and nasal congestion since Tuesday.  Patient reports left ear fullness that started last night.

## 2017-08-24 NOTE — ED Provider Notes (Signed)
[facility]    CSN: [id number] Arrival date & time: 08/24/17  1015     History   Chief Complaint Chief Complaint  Patient presents with  . Cough    HPI Heather Pope is a 51 y.o. female.   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 She states that the last night she felt the fluid in her left canal. She's had no pain. Been using Alka-Seltzer plus as well as Delsym cough syrup. Continues to smoke but is now decreased to 5 cigarettes per day with the intention of cessation in the very near future. She has not had any fever or chills.         Past Medical History:  Diagnosis Date  . Endometriosis     Patient Active Problem List   Diagnosis Date Noted  . Vitamin D deficiency 05/17/2016  . Routine general medical examination at a health care facility 05/11/2015  . Adjustment disorder with mixed anxiety and depressed mood 05/11/2015  . Abdominal pain, right upper quadrant 05/11/2015  . Smoker 03/24/2013  . Palpitations 03/19/2013  . Low back pain 05/28/2012  . Lateral meniscal tear 05/28/2012    Past Surgical History:  Procedure Laterality Date  . ABDOMINAL HYSTERECTOMY     Dr. Vernie Ammons, one ovary in place  . BREAST SURGERY     Dr. Koleen Nimrod, normal  . SPINAL FUSION    . SPINE SURGERY  2011   L4-5, S1 fusion, Dr. Mauri Pole  . VAGINAL DELIVERY     3    OB History    No data available       Home Medications    Prior to Admission medications   Medication Sig Start Date End Date Taking? Authorizing Provider  acetaminophen (TYLENOL) 500 MG tablet Take 500 mg by mouth every 6 (six) hours as needed. 2 tablets three times daily     [provider]  albuterol (PROVENTIL HFA;VENTOLIN HFA) 108 (90 Base) MCG/ACT inhaler Inhale 1-2 puffs every 6 (six) hours as needed into the lungs for  wheezing or shortness of breath. Use with spacer 08/24/17   Lorin Picket, PA-C  ALPRAZolam Duanne Moron) 0.25 MG tablet Take 1 tablet (0.25 mg total) by mouth 3 (three) times daily as needed for anxiety. 07/22/17   Burnard Hawthorne, FNP  benzonatate (TESSALON) 200 MG capsule Take one cap TID PRN cough 08/24/17   Crecencio Mc P, PA-C  cyclobenzaprine (FLEXERIL) 5 MG tablet Take 1 tablet (5 mg total) by mouth 2 (two) times daily. 07/22/17   Burnard Hawthorne, FNP  fluticasone (FLONASE) 50 MCG/ACT nasal spray Place 2 sprays daily into both nostrils. 08/24/17   Lorin Picket, PA-C    Family History Family History  Problem Relation Age of Onset  . Heart disease Mother   . Diabetes Mother   . Heart disease Maternal Grandmother   . Heart disease Maternal Grandfather        CABG  . Multiple sclerosis Daughter   . Cancer Maternal Aunt        brain  . Breast cancer Neg Hx     Social History Social History   Tobacco Use  . Smoking status: Current Every Day Smoker    Packs/day: 0.50    Years: 35.00    Pack years: 17.50    Types: Cigarettes  . Smokeless tobacco:  Never Used  Substance Use Topics  . Alcohol use: No  . Drug use: No     Allergies   Ibuprofen; Levaquin [levofloxacin in d5w]; Oxycodone; and Penicillins   Review of Systems Review of Systems  Constitutional: Positive for activity change and fatigue. Negative for appetite change, chills, diaphoresis and fever.  HENT: Positive for congestion, ear discharge, postnasal drip, rhinorrhea, sinus pressure and sore throat.   Eyes: Negative.   Respiratory: Positive for cough and shortness of breath.   All other systems reviewed and are negative.    Physical Exam Triage Vital Signs ED Triage Vitals  Enc Vitals Group     BP 08/24/17 1036 121/77     Pulse Rate 08/24/17 1036 78     Resp 08/24/17 1036 16     Temp 08/24/17 1036 98 F (36.7 C)     Temp Source 08/24/17 1036 Oral     SpO2 08/24/17 1036 100 %     Weight  08/24/17 1033 195 lb 8.8 oz (88.7 kg)     Height 08/24/17 1033 5\' 8"  (1.727 m)     Head Circumference --      Peak Flow --      Pain Score 08/24/17 1034 0     Pain Loc --      Pain Edu? --      Excl. in [place]? --    No data found.  Updated Vital Signs BP 121/77 (BP Location: Left Arm)   Pulse 78   Temp 98 F (36.7 C) (Oral)   Resp 16   Ht 5\' 8"  (1.727 m)   Wt 195 lb 8.8 oz (88.7 kg)   SpO2 100%   BMI 29.73 kg/m   Visual Acuity Right Eye Distance:   Left Eye Distance:   Bilateral Distance:    Right Eye Near:   Left Eye Near:    Bilateral Near:     Physical Exam  Constitutional: She is oriented to person, place, and time. She appears well-developed and well-nourished. No distress.  HENT:  Head: Normocephalic.  Nose: Nose normal.  Mouth/Throat: Oropharynx is clear and moist. No oropharyngeal exudate.  Both TMs appear dull and dark. Is no identifiable fluid in either canal.  Eyes: Pupils are equal, round, and reactive to light. Right eye exhibits no discharge. Left eye exhibits no discharge.  Neck: Normal range of motion.  Pulmonary/Chest: Effort normal and breath sounds normal. No stridor. No respiratory distress. She has no wheezes. She has no rales.  Musculoskeletal: Normal range of motion.  Lymphadenopathy:    She has no cervical adenopathy.  Neurological: She is alert and oriented to person, place, and time.  Skin: Skin is warm and dry. She is not diaphoretic.  Psychiatric: She has a normal mood and affect. Her behavior is normal. Judgment and thought content normal.  Nursing note and vitals reviewed.    UC Treatments / Results  Labs (all labs ordered are listed, but only abnormal results are displayed) Labs Reviewed - No data to display  EKG  EKG Interpretation None       Radiology No results found.  Procedures Procedures (including critical care time)  Medications Ordered in UC Medications - No data to display   Initial Impression / Assessment  and Plan / UC Course  I have reviewed the triage vital signs and the nursing notes.  Pertinent labs & imaging results that were available during my care of the patient were reviewed by me and considered in my medical  decision making (see chart for details).     Plan: 1. Test/x-ray results and diagnosis reviewed with patient 2. rx as per orders; risks, benefits, potential side effects reviewed with patient 3. Recommend supportive treatment with fluids and rest. Recommend cessation of smoking as soon as possible. Use albuterol inhaler with spacer as instructed. If she has continuing productive cough and increased shortness of breath and/or high fevers she should return to the clinic or see her primary care physician. I have told her this is likely a virus and does not require antibiotics at this time. She should use the of Flonase for inflammation of the eustachian tube. 4. F/u prn if symptoms worsen or don't improve   Final Clinical Impressions(s) / UC Diagnoses   Final diagnoses:  Upper respiratory tract infection, unspecified type  Eustachian tube dysfunction, bilateral    ED Discharge Orders        Ordered    albuterol (PROVENTIL HFA;VENTOLIN HFA) 108 (90 Base) MCG/ACT inhaler  Every 6 hours PRN    Comments:  Provide spacer and instructions to patient   08/24/17 1110    benzonatate (TESSALON) 200 MG capsule     08/24/17 1110    fluticasone (FLONASE) 50 MCG/ACT nasal spray  Daily     08/24/17 1110       Controlled Substance Prescriptions [place] Controlled Substance Registry consulted? Not Applicable   Lorin Picket, PA-C 08/24/17 1123

## 2017-11-14 ENCOUNTER — Ambulatory Visit (INDEPENDENT_AMBULATORY_CARE_PROVIDER_SITE_OTHER): Payer: Self-pay | Admitting: Family Medicine

## 2017-11-14 ENCOUNTER — Encounter: Payer: Self-pay | Admitting: Family Medicine

## 2017-11-14 VITALS — BP 120/77 | HR 67 | Temp 97.6°F | Wt 200.0 lb

## 2017-11-14 DIAGNOSIS — J039 Acute tonsillitis, unspecified: Secondary | ICD-10-CM

## 2017-11-14 DIAGNOSIS — H66001 Acute suppurative otitis media without spontaneous rupture of ear drum, right ear: Secondary | ICD-10-CM

## 2017-11-14 LAB — POCT RAPID STREP A (OFFICE): RAPID STREP A SCREEN: NEGATIVE

## 2017-11-14 LAB — POCT INFLUENZA A/B
Influenza A, POC: NEGATIVE
Influenza B, POC: NEGATIVE

## 2017-11-14 MED ORDER — FLUCONAZOLE 150 MG PO TABS: 150.0000 mg | ORAL_TABLET | Freq: Once | ORAL | 0 refills | Status: AC

## 2017-11-14 MED ORDER — DOXYCYCLINE HYCLATE 100 MG PO CAPS: 100.0000 mg | ORAL_CAPSULE | Freq: Two times a day (BID) | ORAL | 0 refills | Status: DC

## 2017-11-14 MED ORDER — PREDNISONE 20 MG PO TABS: 40.0000 mg | ORAL_TABLET | Freq: Every day | ORAL | 0 refills | Status: DC

## 2017-11-14 NOTE — Progress Notes (Signed)
Subjective:  Martrice Apt is a 52 y.o. female who presents for evaluation of sore throat and left ear pain. Symptoms include fatigue, headache, swollen lymph nodes, pain with swallowing, mild non-productive cough. Onset of symptoms occurred two days prior. Overall symptoms have rapidly worsening since that time and she had to leave work today. Treatment to date Alka-Seltzer Flu combination which includes acetaminophen, therefore she's uncertain as to whether or not a fever has been present. 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  The following portions of the patient's history were reviewed and updated as appropriate:  allergies, current medications and past medical history.  Pertinent items are noted in HPI.   Objective:  BP 120/77   Pulse 67   Temp 97.6 F (36.4 C)   Wt 200 lb (90.7 kg)   SpO2 96%   BMI 30.41 kg/m  General appearance: alert, cooperative and no distress Eyes: negative Ears: abnormal TM right ear - erythematous Nose: Nares normal. Septum midline. Mucosa normal. No drainage or sinus tenderness., no discharge Throat: abnormal findings: marked oropharyngeal erythema and tonsillar hypertrophy touching at midline Neck: moderate anterior cervical adenopathy and supple, symmetrical, trachea midline Lungs: clear to auscultation bilaterally Heart: regular rate and rhythm, S1, S2 normal, no murmur, click, rub or gallop  Assessment:  Tonsilitis and Acute Otitis Media -Left     Plan:  Doxycycle 100 mg twice daily  per orders.  For inflammation, prednisone 40 mg x 5 days with breakfast.  Diflucan 150 mg provided if vaginal irritation occurs.   Meds ordered this encounter  Medications  . doxycycline (VIBRAMYCIN) 100 MG capsule    Sig: Take 1 capsule (100 mg total) by mouth 2 (two) times daily.    Dispense:  20 capsule    Refill:  0  . predniSONE (DELTASONE) 20 MG tablet    Sig: Take 2 tablets (40 mg total) by mouth daily  with breakfast.    Dispense:  10 tablet    Refill:  0  . fluconazole (DIFLUCAN) 150 MG tablet    Sig: Take 1 tablet (150 mg total) by mouth once for 1 dose.    Dispense:  1 tablet    Refill:  0    Return for care if symptoms worsen or do not improve.  Follow-up to the ED if you experience difficulty breathing or difficulty swallowing.  Carroll Sage. Kenton Kingfisher, MSN, [facility], [place] [address] [phone]

## 2017-11-14 NOTE — Patient Instructions (Addendum)
I am treating her tonsillitis and ear infection with doxycycline 100 mg twice daily times 10 days.  If vaginal irritation develops, I have ordered a diflucan 150 mg.  I am also adding prednisone 40 mg x 5 days with food.   If you develop any problems with being able to breathe or swallow you should go immediately to the ED.      Tonsillitis Tonsillitis is an infection of the throat. This infection causes the tonsils to become red, tender, and swollen. Tonsils are tissues in the back of your throat. If bacteria caused your infection, antibiotic medicine will be given to you. Sometimes, symptoms of this infection can be helped with the use of steroid medicine. If your tonsillitis is very bad (severe) and happens often, you may need to get your tonsils removed (tonsillectomy). Follow these instructions at home: Medicines  Take over-the-counter and prescription medicines only as told by your doctor.  If you were prescribed an antibiotic, take it as told by your doctor. Do not stop taking the antibiotic even if you start to feel better. Eating and drinking  Drink enough fluid to keep your pee (urine) clear or pale yellow.  While your throat is sore, eat soft or liquid foods like: ? Soup. ? Sherbert. ? Instant breakfast drinks.  Drink warm fluids.  Eat frozen ice pops. General instructions  Rest as much as possible and get plenty of sleep.  Gargle with a salt-water mixture 3-4 times a day or as needed. To make a salt-water mixture, completely dissolve -1 tsp of salt in 1 cup of warm water.  Wash your hands often with soap and water. If there is no soap and water, use hand sanitizer.  Do not share cups, bottles, or other utensils until your symptoms are gone.  Do not smoke. If you need help quitting, ask your doctor.  Keep all follow-up visits as told by your doctor. This is important. Contact a doctor if:  You have large, tender lumps in your neck.  You have a fever that  does not go away after 2-3 days.  You have a rash.  You cough up green, yellow-brown, or bloody fluid.  You cannot swallow liquids or food for 24 hours.  Only one of your tonsils is swollen. Get help right away if:  You have any new symptoms such as: ? Vomiting ? Very bad headache ? Stiff neck ? Chest pain ? Trouble breathing or swallowing  You have very bad throat pain and also have drooling or voice changes.  You have very bad pain that is not helped by medicine.  You cannot fully open your mouth.  You have redness, swelling, or severe pain anywhere in your neck. Summary  Tonsillitis causes your tonsils to be red, tender, and swollen.  While your throat is sore eat soft or liquid foods.  Gargle with a salt-water mixture 3-4 times a day or as needed.  Do not share cups, bottles, or other utensils until your symptoms are gone. This information is not intended to replace advice given to you by your health care provider. Make sure you discuss any questions you have with your health care provider. Document Released: 03/12/2008 Document Revised: 03/01/2016 Document Reviewed: 03/13/2013 Elsevier Interactive Patient Education  2017 [employer].

## 2018-01-24 ENCOUNTER — Ambulatory Visit (INDEPENDENT_AMBULATORY_CARE_PROVIDER_SITE_OTHER): Payer: Self-pay | Admitting: Medical

## 2018-01-24 VITALS — BP 131/70 | HR 60 | Temp 97.9°F | Wt 210.0 lb

## 2018-01-24 DIAGNOSIS — H6501 Acute serous otitis media, right ear: Secondary | ICD-10-CM

## 2018-01-24 MED ORDER — AZITHROMYCIN 250 MG PO TABS: ORAL_TABLET | ORAL | 0 refills | Status: DC

## 2018-01-24 NOTE — Progress Notes (Signed)
   Subjective:    Patient ID: Heather Pope, female    DOB: 08-25-1966, 52 y.o.   MRN: [id number]  HPI 52 yo female in non acute distress Right ear pain x 1.5 weeks used sweet oil and "some ear drops"  Review of Systems  Constitutional: Negative for chills and fever.  HENT: Positive for congestion, ear pain and sinus pressure.   Eyes: Negative for discharge and itching.  Respiratory: Negative for cough.   Cardiovascular: Negative for chest pain.  Genitourinary: Negative for dysuria.  Musculoskeletal: Negative for myalgias.  Skin: Negative for rash.  Neurological: Positive for dizziness (yesterday, lasted 10sec then resolved.). Negative for syncope, light-headedness and headaches.  Hematological: Negative for adenopathy.  Psychiatric/Behavioral: Negative for behavioral problems, self-injury and suicidal ideas. The patient is nervous/anxious (controls it with xanax.).        Objective:   Physical Exam  Constitutional: She is oriented to person, place, and time. She appears well-developed and well-nourished.  HENT:  Head: Normocephalic and atraumatic.  Right Ear: Hearing, external ear and ear canal normal. Tympanic membrane is erythematous.  Left Ear: Hearing, external ear and ear canal normal. A middle ear effusion is present.  Nose: Nose normal.  Mouth/Throat: Uvula is midline, oropharynx is clear and moist and mucous membranes are normal.  Eyes: Pupils are equal, round, and reactive to light. Conjunctivae and EOM are normal.  Neck: Normal range of motion. Neck supple.  Cardiovascular: Normal rate, regular rhythm and normal heart sounds. Exam reveals no gallop and no friction rub.  No murmur heard. Pulmonary/Chest: Effort normal and breath sounds normal.  Abdominal: Soft. Bowel sounds are normal.  Musculoskeletal: Normal range of motion.  Neurological: She is alert and oriented to person, place, and time.  Skin: Skin is warm and dry.  Psychiatric: She has a normal mood  and affect. Her behavior is normal. Judgment and thought content normal.          Assessment & Plan:  Acute otitis media right Meds ordered this encounter  Medications  . azithromycin (ZITHROMAX) 250 MG tablet    Sig: Take 2 tablets by mouth today then one tablet days 2-5 , take with food    Dispense:  6 tablet    Refill:  0  Take OTC Tylenol as directed for pain. Return in 3-5 days if not improving. Patient verbalizes understanding and has no questions at discharge. Treg Diemer PA-C Eustachian tube dysfunction Left

## 2018-01-24 NOTE — Patient Instructions (Signed)
Eustachian Tube Dysfunction The eustachian tube connects the middle ear to the back of the nose. It regulates air pressure in the middle ear by allowing air to move between the ear and nose. It also helps to drain fluid from the middle ear space. When the eustachian tube does not function properly, air pressure, fluid, or both can build up in the middle ear. Eustachian tube dysfunction can affect one or both ears. What are the causes? This condition happens when the eustachian tube becomes blocked or cannot open normally. This may result from:  Ear infections.  Colds and other upper respiratory infections.  Allergies.  Irritation, such as from cigarette smoke or acid from the stomach coming up into the esophagus (gastroesophageal reflux).  Sudden changes in air pressure, such as from descending in an airplane.  Abnormal growths in the nose or throat, such as nasal polyps, tumors, or enlarged tissue at the back of the throat (adenoids).  What increases the risk? This condition may be more likely to develop in people who smoke and people who are overweight. Eustachian tube dysfunction may also be more likely to develop in children, especially children who have:  Certain birth defects of the mouth, such as cleft palate.  Large tonsils and adenoids.  What are the signs or symptoms? Symptoms of this condition may include:  A feeling of fullness in the ear.  Ear pain.  Clicking or popping noises in the ear.  Ringing in the ear.  Hearing loss.  Loss of balance.  Symptoms may get worse when the air pressure around you changes, such as when you travel to an area of high elevation or fly on an airplane. How is this diagnosed? This condition may be diagnosed based on:  Your symptoms.  A physical exam of your ear, nose, and throat.  Tests, such as those that measure: ? The movement of your eardrum (tympanogram). ? Your hearing (audiometry).  How is this treated? Treatment  depends on the cause and severity of your condition. If your symptoms are mild, you may be able to relieve your symptoms by moving air into ("popping") your ears. If you have symptoms of fluid in your ears, treatment may include:  Decongestants.  Antihistamines.  Nasal sprays or ear drops that contain medicines that reduce swelling (steroids).  In some cases, you may need to have a procedure to drain the fluid in your eardrum (myringotomy). In this procedure, a small tube is placed in the eardrum to:  Drain the fluid.  Restore the air in the middle ear space.  Follow these instructions at home:  Take over-the-counter and prescription medicines only as told by your health care provider.  Use techniques to help pop your ears as recommended by your health care provider. These may include: ? Chewing gum. ? Yawning. ? Frequent, forceful swallowing. ? Closing your mouth, holding your nose closed, and gently blowing as if you are trying to blow air out of your nose.  Do not do any of the following until your health care provider approves: ? Travel to high altitudes. ? Fly in airplanes. ? Work in a pressurized cabin or room. ? Scuba dive.  Keep your ears dry. Dry your ears completely after showering or bathing.  Do not smoke.  Keep all follow-up visits as told by your health care provider. This is important. Contact a health care provider if:  Your symptoms do not go away after treatment.  Your symptoms come back after treatment.  You are   unable to pop your ears.  You have: ? A fever. ? Pain in your ear. ? Pain in your head or neck. ? Fluid draining from your ear.  Your hearing suddenly changes.  You become very dizzy.  You lose your balance. This information is not intended to replace advice given to you by your health care provider. Make sure you discuss any questions you have with your health care provider. Document Released: 10/21/2015 Document Revised: 03/01/2016  Document Reviewed: 10/13/2014 Elsevier Interactive Patient Education  2018 [employer]. Otitis Media, Adult Otitis media is redness, soreness, and puffiness (swelling) in the space just behind your eardrum (middle ear). It may be caused by allergies or infection. It often happens along with a cold. Follow these instructions at home:  Take your medicine as told. Finish it even if you start to feel better.  Only take over-the-counter or prescription medicines for pain, discomfort, or fever as told by your doctor.  Follow up with your doctor as told. Contact a doctor if:  You have otitis media only in one ear, or bleeding from your nose, or both.  You notice a lump on your neck.  You are not getting better in 3-5 days.  You feel worse instead of better. Get help right away if:  You have pain that is not helped with medicine.  You have puffiness, redness, or pain around your ear.  You get a stiff neck.  You cannot move part of your face (paralysis).  You notice that the bone behind your ear hurts when you touch it. This information is not intended to replace advice given to you by your health care provider. Make sure you discuss any questions you have with your health care provider. Document Released: 03/12/2008 Document Revised: 03/01/2016 Document Reviewed: 04/21/2013 Elsevier Interactive Patient Education  2017 [employer].

## 2018-01-27 ENCOUNTER — Telehealth: Payer: Self-pay | Admitting: Emergency Medicine

## 2018-01-27 NOTE — Telephone Encounter (Signed)
Spoke with patient doing alittle bit better still taking the zpak. Informed her to continue regiment if any issues give a call. Patient acknowledge understanding

## 2018-04-28 DIAGNOSIS — H04302 Unspecified dacryocystitis of left lacrimal passage: Secondary | ICD-10-CM | POA: Diagnosis not present

## 2018-05-21 ENCOUNTER — Other Ambulatory Visit: Payer: Self-pay | Admitting: Otolaryngology

## 2018-05-21 DIAGNOSIS — J3489 Other specified disorders of nose and nasal sinuses: Secondary | ICD-10-CM | POA: Diagnosis not present

## 2018-05-21 DIAGNOSIS — H9312 Tinnitus, left ear: Secondary | ICD-10-CM | POA: Diagnosis not present

## 2018-05-21 DIAGNOSIS — H9311 Tinnitus, right ear: Secondary | ICD-10-CM

## 2018-06-05 ENCOUNTER — Ambulatory Visit
Admission: RE | Admit: 2018-06-05 | Discharge: 2018-06-05 | Disposition: A | Discharge status: Home / Self Care | Payer: 59 | Source: Ambulatory Visit | Attending: Otolaryngology | Admitting: Otolaryngology

## 2018-06-05 DIAGNOSIS — H9311 Tinnitus, right ear: Secondary | ICD-10-CM | POA: Insufficient documentation

## 2018-06-05 DIAGNOSIS — R42 Dizziness and giddiness: Secondary | ICD-10-CM | POA: Diagnosis not present

## 2018-06-05 MED ORDER — GADOBENATE DIMEGLUMINE 529 MG/ML IV SOLN
20.0000 mL | Freq: Once | INTRAVENOUS | Status: AC | PRN
  Administered 2018-06-05: 20 mL via INTRAVENOUS

## 2018-06-10 DIAGNOSIS — J342 Deviated nasal septum: Secondary | ICD-10-CM | POA: Diagnosis not present

## 2018-07-08 ENCOUNTER — Telehealth: Payer: Self-pay

## 2018-07-08 ENCOUNTER — Other Ambulatory Visit: Payer: Self-pay | Admitting: Family

## 2018-07-08 DIAGNOSIS — Z1231 Encounter for screening mammogram for malignant neoplasm of breast: Secondary | ICD-10-CM

## 2018-07-08 NOTE — Telephone Encounter (Signed)
appt scheduled 07/08/18

## 2018-07-08 NOTE — Telephone Encounter (Signed)
Copied from [place] [id number]. Topic: Appointment Scheduling - Prior Auth Required for Appointment >> Jul 08, 2018 10:37 AM Heather Pope wrote: Patient will be having sinus surgery on 10/16. She is needed clearance before then. She wants to know could she be worked in on 10/4 that afternoon or either next Wednesday or next Thursday ( she is off those two days ). Please advise.

## 2018-07-11 ENCOUNTER — Ambulatory Visit: Payer: 59 | Admitting: Family

## 2018-07-11 ENCOUNTER — Encounter: Payer: Self-pay | Admitting: Family

## 2018-07-11 VITALS — BP 122/80 | HR 70 | Temp 98.0°F | Resp 15 | Ht 69.0 in | Wt 204.0 lb

## 2018-07-11 DIAGNOSIS — Z419 Encounter for procedure for purposes other than remedying health state, unspecified: Secondary | ICD-10-CM | POA: Diagnosis not present

## 2018-07-11 DIAGNOSIS — F4323 Adjustment disorder with mixed anxiety and depressed mood: Secondary | ICD-10-CM

## 2018-07-11 DIAGNOSIS — Z23 Encounter for immunization: Secondary | ICD-10-CM

## 2018-07-11 DIAGNOSIS — Z0000 Encounter for general adult medical examination without abnormal findings: Secondary | ICD-10-CM | POA: Insufficient documentation

## 2018-07-11 DIAGNOSIS — R110 Nausea: Secondary | ICD-10-CM | POA: Diagnosis not present

## 2018-07-11 LAB — EKG 12-LEAD

## 2018-07-11 MED ORDER — ONDANSETRON 4 MG PO TBDP: 4.0000 mg | ORAL_TABLET | Freq: Three times a day (TID) | ORAL | 0 refills | Status: DC | PRN

## 2018-07-11 MED ORDER — ALPRAZOLAM 0.25 MG PO TABS: 0.2500 mg | ORAL_TABLET | Freq: Every day | ORAL | 1 refills | Status: DC | PRN

## 2018-07-11 NOTE — Assessment & Plan Note (Signed)
Using Xanax, appropriately and rarely. I looked up patient on  Controlled Substances Reporting System and saw no activity that raised concern of inappropriate use.

## 2018-07-11 NOTE — Progress Notes (Signed)
Subjective:    Patient ID: Heather Pope, female    DOB: 02-29-1966, 52 y.o.   MRN: [id number]  CC: Heather Pope is a 52 y.o. female who presents today for follow up.   HPI: Here for sinus surgery clearance with Dr Pryor Ochoa 07/23/18.   Feels well . No complaints   Plans to quit smoking  GAD- would like xanax refilled. Uses very sparingly. 'feels controlled.'  No depression. No si/hi.    Anesthesia makes her sick, would like zofran or phenergan for after surgery.      No CP, sob. No formal exercise. Walks at work.   Stress test Dr Rockey Situ normal 2014.  She also notes a palpitation at that time, these have since resolved.                   Never got cologuard HISTORY:  Past Medical History:  Diagnosis Date  . Endometriosis    Past Surgical History:  Procedure Laterality Date  . ABDOMINAL HYSTERECTOMY     Dr. Vernie Ammons, one ovary in place  . BREAST SURGERY     Dr. Koleen Nimrod, normal  . SPINAL FUSION    . SPINE SURGERY  2011   L4-5, S1 fusion, Dr. Mauri Pole  . VAGINAL DELIVERY     3   Family History  Problem Relation Age of Onset  . Heart disease Mother   . Diabetes Mother   . Heart disease Maternal Grandmother   . Heart disease Maternal Grandfather        CABG  . Multiple sclerosis Daughter   . Cancer Maternal Aunt        brain  . Breast cancer Neg Hx     Allergies: Ibuprofen; Levaquin [levofloxacin in d5w]; Oxycodone; and Penicillins Current Outpatient Medications on File Prior to Visit  Medication Sig Dispense Refill  . acetaminophen (TYLENOL) 500 MG tablet Take 500 mg by mouth every 6 (six) hours as needed. 2 tablets three times daily     . cyclobenzaprine (FLEXERIL) 5 MG tablet Take 1 tablet (5 mg total) by mouth 2 (two) times daily. 60 tablet 1  . fluticasone (FLONASE) 50 MCG/ACT nasal spray Place 2 sprays daily into both nostrils. 16 g 0   No current facility-administered medications on file prior to visit.     Social History    Tobacco Use  . Smoking status: Current Every Day Smoker    Packs/day: 0.50    Years: 35.00    Pack years: 17.50    Types: Cigarettes  . Smokeless tobacco: Never Used  Substance Use Topics  . Alcohol use: No  . Drug use: No    Review of Systems  Constitutional: Negative for chills and fever.  Respiratory: Negative for cough and shortness of breath.   Cardiovascular: Negative for chest pain, palpitations and leg swelling.  Gastrointestinal: Negative for nausea and vomiting.  Psychiatric/Behavioral: Negative for suicidal ideas. The patient is nervous/anxious.       Objective:    BP 122/80 (BP Location: Left Arm, Patient Position: Sitting, Cuff Size: Normal)   Pulse 70   Temp 98 F (36.7 C) (Oral)   Resp 15   Ht 5\' 9"  (1.753 m)   Wt 204 lb (92.5 kg)   SpO2 96%   BMI 30.13 kg/m  BP Readings from Last 3 Encounters:  07/11/18 122/80  01/24/18 131/70  11/14/17 120/77   Wt Readings from Last 3 Encounters:  07/11/18 204 lb (92.5 kg)  01/24/18 210 lb (95.3  kg)  11/14/17 200 lb (90.7 kg)    Physical Exam  Constitutional: She appears well-developed and well-nourished.  Eyes: Conjunctivae are normal.  Cardiovascular: Normal rate, regular rhythm, normal heart sounds and normal pulses.  Pulmonary/Chest: Effort normal and breath sounds normal. She has no wheezes. She has no rhonchi. She has no rales.  Neurological: She is alert.  Skin: Skin is warm and dry.  Psychiatric: She has a normal mood and affect. Her speech is normal and behavior is normal. Thought content normal.  Vitals reviewed.      Assessment & Plan:   Problem List Items Addressed This Visit      Other   Adjustment disorder with mixed anxiety and depressed mood    Using Xanax, appropriately and rarely. I looked up patient on [place] Controlled Substances Reporting System and saw no activity that raised concern of inappropriate use.        Relevant Medications   ALPRAZolam (XANAX) 0.25 MG tablet    Surgery, elective - Primary    Pending labs.  EKG today showed no acute ischemia.  When compared to prior in 2014, no cystic changes. Based on this, advised patient that she appears to be moderate risk for surgery.      Relevant Medications   ondansetron (ZOFRAN ODT) 4 MG disintegrating tablet   Other Relevant Orders   CBC with Differential/Platelet   Comprehensive metabolic panel   Hemoglobin A1c   VITAMIN D 25 Hydroxy (Vit-D Deficiency, Fractures)   INR/PT   EKG 12-Lead (Completed)   EKG 12-Lead (Completed)   Nausea    Have given patient a few tabs of Zofran for postop nausea.       Other Visit Diagnoses    Need for immunization against influenza       Relevant Orders   Flu Vaccine QUAD 36+ mos IM (Completed)       I have discontinued Heather Pope's albuterol, benzonatate, doxycycline, predniSONE, and azithromycin. I have also changed her ALPRAZolam. Additionally, I am having her start on ondansetron. Lastly, I am having her maintain her acetaminophen, cyclobenzaprine, and fluticasone.   Meds ordered this encounter  Medications  . ondansetron (ZOFRAN ODT) 4 MG disintegrating tablet    Sig: Take 1 tablet (4 mg total) by mouth every 8 (eight) hours as needed for nausea or vomiting.    Dispense:  30 tablet    Refill:  0    Order Specific Question:   Supervising Provider    Answer:   Deborra Medina L [[id number]]  . ALPRAZolam (XANAX) 0.25 MG tablet    Sig: Take 1 tablet (0.25 mg total) by mouth daily as needed for anxiety.    Dispense:  30 tablet    Refill:  1    Order Specific Question:   Supervising Provider    Answer:   Crecencio Mc [[id number]]    Return precautions given.   Risks, benefits, and alternatives of the medications and treatment plan prescribed today were discussed, and patient expressed understanding.   Education regarding symptom management and diagnosis given to patient on AVS.  Continue to follow with Burnard Hawthorne, FNP for routine health  maintenance.   Heather Pope and I agreed with plan.   Mable Paris, FNP

## 2018-07-11 NOTE — Patient Instructions (Addendum)
Please return cologuard order  This is very important  Please return for annual physical when you can  Good luck with surgery

## 2018-07-11 NOTE — Assessment & Plan Note (Signed)
Pending labs.  EKG today showed no acute ischemia.  When compared to prior in 2014, no cystic changes. Based on this, advised patient that she appears to be moderate risk for surgery.

## 2018-07-11 NOTE — Assessment & Plan Note (Signed)
Have given patient a few tabs of Zofran for postop nausea.

## 2018-07-12 LAB — CBC WITH DIFFERENTIAL/PLATELET
BASOS PCT: 0.5 %
Basophils Absolute: 46 cells/uL (ref 0–200)
Eosinophils Absolute: 173 cells/uL (ref 15–500)
Eosinophils Relative: 1.9 %
HCT: 41.8 % (ref 35.0–45.0)
Hemoglobin: 14.1 g/dL (ref 11.7–15.5)
Lymphs Abs: 2685 cells/uL (ref 850–3900)
MCH: 29.1 pg (ref 27.0–33.0)
MCHC: 33.7 g/dL (ref 32.0–36.0)
MCV: 86.4 fL (ref 80.0–100.0)
MONOS PCT: 5.4 %
MPV: 11.4 fL (ref 7.5–12.5)
Neutro Abs: 5706 cells/uL (ref 1500–7800)
Neutrophils Relative %: 62.7 %
PLATELETS: 285 10*3/uL (ref 140–400)
RBC: 4.84 10*6/uL (ref 3.80–5.10)
RDW: 13.3 % (ref 11.0–15.0)
TOTAL LYMPHOCYTE: 29.5 %
WBC mixed population: 491 cells/uL (ref 200–950)
WBC: 9.1 10*3/uL (ref 3.8–10.8)

## 2018-07-12 LAB — COMPREHENSIVE METABOLIC PANEL
AG Ratio: 2.1 (calc) (ref 1.0–2.5)
ALBUMIN MSPROF: 4.7 g/dL (ref 3.6–5.1)
ALT: 9 U/L (ref 6–29)
AST: 16 U/L (ref 10–35)
Alkaline phosphatase (APISO): 56 U/L (ref 33–130)
BUN: 12 mg/dL (ref 7–25)
CO2: 26 mmol/L (ref 20–32)
Calcium: 10.6 mg/dL — ABNORMAL HIGH (ref 8.6–10.4)
Chloride: 102 mmol/L (ref 98–110)
Creat: 0.79 mg/dL (ref 0.50–1.05)
GLOBULIN: 2.2 g/dL (ref 1.9–3.7)
GLUCOSE: 91 mg/dL (ref 65–99)
Potassium: 4.2 mmol/L (ref 3.5–5.3)
Sodium: 139 mmol/L (ref 135–146)
Total Bilirubin: 0.5 mg/dL (ref 0.2–1.2)
Total Protein: 6.9 g/dL (ref 6.1–8.1)

## 2018-07-12 LAB — VITAMIN D 25 HYDROXY (VIT D DEFICIENCY, FRACTURES): Vit D, 25-Hydroxy: 18 ng/mL — ABNORMAL LOW (ref 30–100)

## 2018-07-12 LAB — PROTIME-INR
INR: 1
Prothrombin Time: 10.3 s (ref 9.0–11.5)

## 2018-07-12 LAB — HEMOGLOBIN A1C
HEMOGLOBIN A1C: 5.1 %{Hb} (ref <5.7)
MEAN PLASMA GLUCOSE: 100 (calc)
eAG (mmol/L): 5.5 (calc)

## 2018-07-14 ENCOUNTER — Other Ambulatory Visit: Payer: Self-pay | Admitting: Family

## 2018-07-15 ENCOUNTER — Other Ambulatory Visit: Payer: Self-pay

## 2018-07-15 ENCOUNTER — Encounter: Payer: Self-pay | Admitting: *Deleted

## 2018-07-17 DIAGNOSIS — J342 Deviated nasal septum: Secondary | ICD-10-CM | POA: Diagnosis not present

## 2018-07-21 ENCOUNTER — Ambulatory Visit
Admission: RE | Admit: 2018-07-21 | Discharge: 2018-07-21 | Disposition: A | Discharge status: Home / Self Care | Payer: 59 | Source: Ambulatory Visit | Attending: Family | Admitting: Family

## 2018-07-21 DIAGNOSIS — Z1231 Encounter for screening mammogram for malignant neoplasm of breast: Secondary | ICD-10-CM | POA: Insufficient documentation

## 2018-07-22 NOTE — Discharge Instructions (Signed)
[facility] °[facility] °ENDOSCOPIC SINUS SURGERY °[facility] ° °What is Functional Endoscopic Sinus Surgery? ° The Surgery involves making the natural openings of the sinuses larger by removing the bony partitions that separate the sinuses from the nasal cavity.  The natural sinus lining is preserved as much as possible to allow the sinuses to resume normal function after the surgery.  In some patients nasal polyps (excessively swollen lining of the sinuses) may be removed to relieve obstruction of the sinus openings.  The surgery is performed through the nose using lighted scopes, which eliminates the need for incisions on the face.  A septoplasty is a different procedure which is sometimes performed with sinus surgery.  It involves straightening the boy partition that separates the two sides of your nose.  A crooked or deviated septum may need repair if is obstructing the sinuses or nasal airflow.  Turbinate reduction is also often performed during sinus surgery.  The turbinates are bony proturberances from the side walls of the nose which swell and can obstruct the nose in patients with sinus and allergy problems.  Their size can be surgically reduced to help relieve nasal obstruction. ° °What Can Sinus Surgery Do For Me? ° Sinus surgery can reduce the frequency of sinus infections requiring antibiotic treatment.  This can provide improvement in nasal congestion, post-nasal drainage, facial pressure and nasal obstruction.  Surgery will NOT prevent you from ever having an infection again, so it usually only for patients who get infections 4 or more times yearly requiring antibiotics, or for infections that do not clear with antibiotics.  It will not cure nasal allergies, so patients with allergies may still require medication to treat their allergies after surgery. Surgery may improve headaches related to sinusitis, however, some people will continue to  require medication to control sinus headaches related to allergies.  Surgery will do nothing for other forms of headache (migraine, tension or cluster). ° °What Are the Risks of Endoscopic Sinus Surgery? ° Current techniques allow surgery to be performed safely with little risk, however, there are rare complications that patients should be aware of.  Because the sinuses are located around the eyes, there is risk of eye injury, including blindness, though again, this would be quite rare. This is usually a result of bleeding behind the eye during surgery, which puts the vision oat risk, though there are treatments to protect the vision and prevent permanent disrupted by surgery causing a leak of the spinal fluid that surrounds the brain.  More serious complications would include bleeding inside the brain cavity or damage to the brain.  Again, all of these complications are uncommon, and spinal fluid leaks can be safely managed surgically if they occur.  The most common complication of sinus surgery is bleeding from the nose, which may require packing or cauterization of the nose.  Continued sinus have polyps may experience recurrence of the polyps requiring revision surgery.  Alterations of sense of smell or injury to the tear ducts are also rare complications.  ° °What is the Surgery Like, and what is the Recovery? ° The Surgery usually takes a couple of hours to perform, and is usually performed under a general anesthetic (completely asleep).  Patients are usually discharged home after a couple of hours.  Sometimes during surgery it is necessary to pack the nose to control bleeding, and the packing is left in place for 24 - 48 hours, and removed by your surgeon.    If a septoplasty was performed during the procedure, there is often a splint placed which must be removed after 5-7 days.   °Discomfort: Pain is usually mild to moderate, and can be controlled by prescription pain medication or acetaminophen (Tylenol).   Aspirin, Ibuprofen (Advil, Motrin), or Naprosyn (Aleve) should be avoided, as they can cause increased bleeding.  Most patients feel sinus pressure like they have a bad head cold for several days.  Sleeping with your head elevated can help reduce swelling and facial pressure, as can ice packs over the face.  A humidifier may be helpful to keep the mucous and blood from drying in the nose.  ° °Diet: There are no specific diet restrictions, however, you should generally start with clear liquids and a light diet of bland foods because the anesthetic can cause some nausea.  Advance your diet depending on how your stomach feels.  Taking your pain medication with food will often help reduce stomach upset which pain medications can cause. ° °Nasal Saline Irrigation: It is important to remove blood clots and dried mucous from the nose as it is healing.  This is done by having you irrigate the nose at least 3 - 4 times daily with a salt water solution.  We recommend using NeilMed Sinus Rinse (available at the drug store).  Fill the squeeze bottle with the solution, bend over a sink, and insert the tip of the squeeze bottle into the nose ½ of an inch.  Point the tip of the squeeze bottle towards the inside corner of the eye on the same side your irrigating.  Squeeze the bottle and gently irrigate the nose.  If you bend forward as you do this, most of the fluid will flow back out of the nose, instead of down your throat.   The solution should be warm, near body temperature, when you irrigate.   Each time you irrigate, you should use a full squeeze bottle.  ° °Note that if you are instructed to use Nasal Steroid Sprays at any time after your surgery, irrigate with saline BEFORE using the steroid spray, so you do not wash it all out of the nose. °Another product, Nasal Saline Gel (such as AYR Nasal Saline Gel) can be applied in each nostril 3 - 4 times daily to moisture the nose and reduce scabbing or crusting. ° °Bleeding:   Bloody drainage from the nose can be expected for several days, and patients are instructed to irrigate their nose frequently with salt water to help remove mucous and blood clots.  The drainage may be dark red or brown, though some fresh blood may be seen intermittently, especially after irrigation.  Do not blow you nose, as bleeding may occur. If you must sneeze, keep your mouth open to allow air to escape through your mouth. ° °If heavy bleeding occurs: Irrigate the nose with saline to rinse out clots, then spray the nose 3 - 4 times with Afrin Nasal Decongestant Spray.  The spray will constrict the blood vessels to slow bleeding.  Pinch the lower half of your nose shut to apply pressure, and lay down with your head elevated.  Ice packs over the nose may help as well. If bleeding persists despite these measures, you should notify your doctor.  Do not use the Afrin routinely to control nasal congestion after surgery, as it can result in worsening congestion and may affect healing.  ° ° ° °Activity: Return to work varies among patients. Most patients will be   out of work at least 5 - 7 days to recover.  Patient may return to work after they are off of narcotic pain medication, and feeling well enough to perform the functions of their job.  Patients must avoid heavy lifting (over 10 pounds) or strenuous physical for 2 weeks after surgery, so your employer may need to assign you to light duty, or keep you out of work longer if light duty is not possible.  NOTE: you should not drive, operate dangerous machinery, do any mentally demanding tasks or make any important legal or financial decisions while on narcotic pain medication and recovering from the general anesthetic.  °  °Call Your Doctor Immediately if You Have Any of the Following: °1. Bleeding that you cannot control with the above measures °2. Loss of vision, double vision, bulging of the eye or black eyes. °3. Fever over 101 degrees °4. Neck stiffness with  severe headache, fever, nausea and change in mental state. °You are always encourage to call anytime with concerns, however, please call with requests for pain medication refills during office hours. ° °Office Endoscopy: During follow-up visits your doctor will remove any packing or splints that may have been placed and evaluate and clean your sinuses endoscopically.  Topical anesthetic will be used to make this as comfortable as possible, though you may want to take your pain medication prior to the visit.  How often this will need to be done varies from patient to patient.  After complete recovery from the surgery, you may need follow-up endoscopy from time to time, particularly if there is concern of recurrent infection or nasal polyps. ° ° °General Anesthesia, Adult, Care After °These instructions provide you with information about caring for yourself after your procedure. Your health care provider may also give you more specific instructions. Your treatment has been planned according to current medical practices, but problems sometimes occur. Call your health care provider if you have any problems or questions after your procedure. °What can I expect after the procedure? °After the procedure, it is common to have: °· Vomiting. °· A sore throat. °· Mental slowness. ° °It is common to feel: °· Nauseous. °· Cold or shivery. °· Sleepy. °· Tired. °· Sore or achy, even in parts of your body where you did not have surgery. ° °Follow these instructions at home: °For at least 24 hours after the procedure: °· Do not: °? Participate in activities where you could fall or become injured. °? Drive. °? Use heavy machinery. °? Drink alcohol. °? Take sleeping pills or medicines that cause drowsiness. °? Make important decisions or sign legal documents. °? Take care of children on your own. °· Rest. °Eating and drinking °· If you vomit, drink water, juice, or soup when you can drink without vomiting. °· Drink enough fluid to  keep your urine clear or pale yellow. °· Make sure you have little or no nausea before eating solid foods. °· Follow the diet recommended by your health care provider. °General instructions °· Have a responsible adult stay with you until you are awake and alert. °· Return to your normal activities as told by your health care provider. Ask your health care provider what activities are safe for you. °· Take over-the-counter and prescription medicines only as told by your health care provider. °· If you smoke, do not smoke without supervision. °· Keep all follow-up visits as told by your health care provider. This is important. °Contact a health care provider if: °· You   continue to have nausea or vomiting at home, and medicines are not helpful. °· You cannot drink fluids or start eating again. °· You cannot urinate after 8-12 hours. °· You develop a skin rash. °· You have fever. °· You have increasing redness at the site of your procedure. °Get help right away if: °· You have difficulty breathing. °· You have chest pain. °· You have unexpected bleeding. °· You feel that you are having a life-threatening or urgent problem. °This information is not intended to replace advice given to you by your health care provider. Make sure you discuss any questions you have with your health care provider. °Document Released: 12/31/2000 Document Revised: 02/27/2016 Document Reviewed: 09/08/2015 °Elsevier Interactive Patient Education © 2018 [employer] ° °

## 2018-07-23 ENCOUNTER — Ambulatory Visit: Payer: 59 | Admitting: Anesthesiology

## 2018-07-23 ENCOUNTER — Encounter
Admission: RE | Disposition: A | Discharge status: Home / Self Care | Payer: Self-pay | Source: Ambulatory Visit | Attending: Otolaryngology

## 2018-07-23 ENCOUNTER — Ambulatory Visit
Admission: RE | Admit: 2018-07-23 | Discharge: 2018-07-23 | Disposition: A | Discharge status: Home / Self Care | Payer: 59 | Source: Ambulatory Visit | Attending: Otolaryngology | Admitting: Otolaryngology

## 2018-07-23 DIAGNOSIS — F17200 Nicotine dependence, unspecified, uncomplicated: Secondary | ICD-10-CM | POA: Insufficient documentation

## 2018-07-23 DIAGNOSIS — Z888 Allergy status to other drugs, medicaments and biological substances status: Secondary | ICD-10-CM | POA: Diagnosis not present

## 2018-07-23 DIAGNOSIS — Z885 Allergy status to narcotic agent status: Secondary | ICD-10-CM | POA: Diagnosis not present

## 2018-07-23 DIAGNOSIS — J342 Deviated nasal septum: Secondary | ICD-10-CM | POA: Diagnosis not present

## 2018-07-23 DIAGNOSIS — Z880 Allergy status to penicillin: Secondary | ICD-10-CM | POA: Insufficient documentation

## 2018-07-23 DIAGNOSIS — J343 Hypertrophy of nasal turbinates: Secondary | ICD-10-CM | POA: Diagnosis not present

## 2018-07-23 HISTORY — DX: Gastro-esophageal reflux disease without esophagitis: K21.9

## 2018-07-23 HISTORY — DX: Other specified postprocedural states: Z98.890

## 2018-07-23 HISTORY — DX: Other specified postprocedural states: R11.2

## 2018-07-23 HISTORY — DX: Unspecified osteoarthritis, unspecified site: M19.90

## 2018-07-23 HISTORY — PX: NASAL SEPTOPLASTY W/ TURBINOPLASTY: SHX2070

## 2018-07-23 SURGERY — SEPTOPLASTY, NOSE, WITH NASAL TURBINATE REDUCTION
Anesthesia: General | Site: Nose | Laterality: Bilateral

## 2018-07-23 MED ORDER — EPHEDRINE SULFATE 50 MG/ML IJ SOLN
INTRAMUSCULAR | Status: DC | PRN
  Administered 2018-07-23 (×3): 5 mg via INTRAVENOUS
  Administered 2018-07-23: 10 mg via INTRAVENOUS

## 2018-07-23 MED ORDER — DEXAMETHASONE SODIUM PHOSPHATE 4 MG/ML IJ SOLN
INTRAMUSCULAR | Status: DC | PRN
  Administered 2018-07-23: 4 mg via INTRAVENOUS

## 2018-07-23 MED ORDER — LACTATED RINGERS IV SOLN
10.0000 mL/h | INTRAVENOUS | Status: DC
  Administered 2018-07-23: 10 mL/h via INTRAVENOUS

## 2018-07-23 MED ORDER — FENTANYL CITRATE (PF) 100 MCG/2ML IJ SOLN: 25.0000 ug | INTRAMUSCULAR | Status: DC | PRN

## 2018-07-23 MED ORDER — GLYCOPYRROLATE 0.2 MG/ML IJ SOLN
INTRAMUSCULAR | Status: DC | PRN
  Administered 2018-07-23: 0.1 mg via INTRAVENOUS

## 2018-07-23 MED ORDER — LIDOCAINE HCL (CARDIAC) PF 100 MG/5ML IV SOSY
PREFILLED_SYRINGE | INTRAVENOUS | Status: DC | PRN
  Administered 2018-07-23: 30 mg via INTRAVENOUS

## 2018-07-23 MED ORDER — ACETAMINOPHEN 10 MG/ML IV SOLN
1000.0000 mg | Freq: Once | INTRAVENOUS | Status: AC
  Administered 2018-07-23: 1000 mg via INTRAVENOUS

## 2018-07-23 MED ORDER — SUCCINYLCHOLINE CHLORIDE 20 MG/ML IJ SOLN
INTRAMUSCULAR | Status: DC | PRN
  Administered 2018-07-23: 80 mg via INTRAVENOUS

## 2018-07-23 MED ORDER — PROMETHAZINE HCL 25 MG/ML IJ SOLN: 6.2500 mg | INTRAMUSCULAR | Status: DC | PRN

## 2018-07-23 MED ORDER — FENTANYL CITRATE (PF) 100 MCG/2ML IJ SOLN
INTRAMUSCULAR | Status: DC | PRN
  Administered 2018-07-23: 100 ug via INTRAVENOUS

## 2018-07-23 MED ORDER — MIDAZOLAM HCL 5 MG/5ML IJ SOLN
INTRAMUSCULAR | Status: DC | PRN
  Administered 2018-07-23: 2 mg via INTRAVENOUS

## 2018-07-23 MED ORDER — ONDANSETRON HCL 4 MG/2ML IJ SOLN
INTRAMUSCULAR | Status: DC | PRN
  Administered 2018-07-23: 4 mg via INTRAVENOUS

## 2018-07-23 MED ORDER — OXYMETAZOLINE HCL 0.05 % NA SOLN
NASAL | Status: DC | PRN
  Administered 2018-07-23: 1 via TOPICAL

## 2018-07-23 MED ORDER — PROPOFOL 10 MG/ML IV BOLUS
INTRAVENOUS | Status: DC | PRN
  Administered 2018-07-23: 150 mg via INTRAVENOUS

## 2018-07-23 MED ORDER — LIDOCAINE-EPINEPHRINE 1 %-1:100000 IJ SOLN
INTRAMUSCULAR | Status: DC | PRN
  Administered 2018-07-23: 7.5 mL

## 2018-07-23 MED ORDER — TRAMADOL HCL 50 MG PO TABS
50.0000 mg | ORAL_TABLET | Freq: Once | ORAL | Status: AC
  Administered 2018-07-23: 50 mg via ORAL

## 2018-07-23 SURGICAL SUPPLY — 23 items
CANISTER SUCT 1200ML W/VALVE (MISCELLANEOUS) ×2 IMPLANT
COAG SUCT 10F 3.5MM HAND CTRL (MISCELLANEOUS) ×2 IMPLANT
DRAPE HEAD BAR (DRAPES) ×2 IMPLANT
DRESSING NASL FOAM PST OP SINU (MISCELLANEOUS) IMPLANT
DRSG NASAL FOAM POST OP SINU (MISCELLANEOUS) ×2
ELECT REM PT RETURN 9FT ADLT (ELECTROSURGICAL) ×2
ELECTRODE REM PT RTRN 9FT ADLT (ELECTROSURGICAL) ×1 IMPLANT
GLOVE BIO SURGEON STRL SZ7.5 (GLOVE) ×4 IMPLANT
KIT TURNOVER KIT A (KITS) ×2 IMPLANT
NDL HYPO 25GX1X1/2 BEV (NEEDLE) ×1 IMPLANT
NEEDLE HYPO 25GX1X1/2 BEV (NEEDLE) ×2 IMPLANT
NS IRRIG 500ML POUR BTL (IV SOLUTION) ×1 IMPLANT
PACK ENT CUSTOM (PACKS) ×2 IMPLANT
PATTIES SURGICAL .5 X3 (DISPOSABLE) ×2 IMPLANT
SOL ANTI-FOG 6CC FOG-OUT (MISCELLANEOUS) ×1 IMPLANT
SOL FOG-OUT ANTI-FOG 6CC (MISCELLANEOUS) ×1
SPLINT NASAL SEPTAL BLV .50 ST (MISCELLANEOUS) ×2 IMPLANT
STRAP BODY AND KNEE 60X3 (MISCELLANEOUS) ×2 IMPLANT
SUT CHROMIC 4 0 RB 1X27 (SUTURE) ×2 IMPLANT
SUT ETHILON 3-0 FS-10 30 BLK (SUTURE) ×2
SUTURE EHLN 3-0 FS-10 30 BLK (SUTURE) ×1 IMPLANT
SYR 10ML LL (SYRINGE) ×2 IMPLANT
TOWEL OR 17X26 4PK STRL BLUE (TOWEL DISPOSABLE) ×2 IMPLANT

## 2018-07-23 NOTE — Transfer of Care (Signed)
Immediate Anesthesia Transfer of Care Note  Patient: Heather Pope  Procedure(s) Performed: NASAL SEPTOPLASTY WITH TURBINATE REDUCTION (Bilateral Nose)  Patient Location: PACU  Anesthesia Type: General ETT  Level of Consciousness: awake, alert  and patient cooperative  Airway and Oxygen Therapy: Patient Spontanous Breathing and Patient connected to supplemental oxygen  Post-op Assessment: Post-op Vital signs reviewed, Patient's Cardiovascular Status Stable, Respiratory Function Stable, Patent Airway and No signs of Nausea or vomiting  Post-op Vital Signs: Reviewed and stable  Complications: No apparent anesthesia complications

## 2018-07-23 NOTE — Anesthesia Preprocedure Evaluation (Signed)
Anesthesia Evaluation  Patient identified by MRN, date of birth, ID band Patient awake    Reviewed: Allergy & Precautions, H&P , NPO status , Patient's Chart, lab work & pertinent test results  History of Anesthesia Complications (+) PONV and history of anesthetic complications  Airway Mallampati: I  TM Distance: >3 FB Neck ROM: full    Dental no notable dental hx.    Pulmonary neg pulmonary ROS, Current Smoker,    Pulmonary exam normal breath sounds clear to auscultation       Cardiovascular Normal cardiovascular exam     Neuro/Psych    GI/Hepatic Neg liver ROS, Medicated,  Endo/Other  negative endocrine ROS  Renal/GU negative Renal ROS     Musculoskeletal   Abdominal   Peds  Hematology negative hematology ROS (+)   Anesthesia Other Findings   Reproductive/Obstetrics negative OB ROS                             Anesthesia Physical Anesthesia Plan  ASA: II  Anesthesia Plan: General ETT   Post-op Pain Management:    Induction:   PONV Risk Score and Plan:   Airway Management Planned:   Additional Equipment:   Intra-op Plan:   Post-operative Plan:   Informed Consent: I have reviewed the patients History and Physical, chart, labs and discussed the procedure including the risks, benefits and alternatives for the proposed anesthesia with the patient or authorized representative who has indicated his/her understanding and acceptance.     Plan Discussed with:   Anesthesia Plan Comments:         Anesthesia Quick Evaluation

## 2018-07-23 NOTE — Anesthesia Postprocedure Evaluation (Signed)
Anesthesia Post Note  Patient: Heather Pope  Procedure(s) Performed: NASAL SEPTOPLASTY WITH TURBINATE REDUCTION (Bilateral Nose)  Patient location during evaluation: PACU Anesthesia Type: General Level of consciousness: awake and alert Pain management: pain level controlled Vital Signs Assessment: post-procedure vital signs reviewed and stable Respiratory status: spontaneous breathing Cardiovascular status: blood pressure returned to baseline Anesthetic complications: no    Jaci Standard, III,  Jonda Alanis D

## 2018-07-23 NOTE — H&P (Signed)
..  History and Physical paper copy reviewed and updated date of procedure and will be scanned into system.  Patient seen and examined.  

## 2018-07-23 NOTE — Anesthesia Procedure Notes (Signed)
Procedure Name: Intubation Date/Time: 07/23/2018 7:49 AM Performed by: Mayme Genta, CRNA Pre-anesthesia Checklist: Patient identified, Emergency Drugs available, Suction available, Patient being monitored and Timeout performed Patient Re-evaluated:Patient Re-evaluated prior to induction Oxygen Delivery Method: Circle system utilized Preoxygenation: Pre-oxygenation with 100% oxygen Induction Type: IV induction Ventilation: Mask ventilation without difficulty Laryngoscope Size: Miller and 2 Grade View: Grade I Tube type: Oral Rae Tube size: 7.0 mm Number of attempts: 1 Placement Confirmation: ETT inserted through vocal cords under direct vision,  positive ETCO2 and breath sounds checked- equal and bilateral Tube secured with: Tape Dental Injury: Teeth and Oropharynx as per pre-operative assessment

## 2018-07-23 NOTE — Op Note (Signed)
..07/23/2018  8:49 AM    Meryl Crutch Ceasar Lund  [id number]    Pre-Op Dx:  Deviated Nasal Septum, Hypertrophic Inferior Turbinates  Post-op Dx: Same  Proc: Nasal Septoplasty, Bilateral Partial Reduction Inferior Turbinates   Surg:  Tamarra Geiselman  Anes:  GOT  EBL:  <27ml  Comp:  none  Findings: significant septal deviation to right with impaction on nasal sidewall with comminuted bone and cartilage deviation, bilateral inferior turbinate hypertrophy  Procedure: With the patient in a comfortable supine position,  general orotracheal anesthesia was induced without difficulty.  The patient received preoperative Afrin spray for topical decongestion and vasoconstriction.  At an appropriate level, the patient was placed in a semi-sitting position.  Nasal vibrissae were trimmed.   1% Xylocaine with 1:100,000 epinephrine, 7.5 cc's, was infiltrated into the anterior floor of the nose, into the nasal spine region, into the membranous columella, and finally into the submucoperichondrial plane of the septum on both sides.  Several minutes were allowed for this to take effect.  Cottoniod pledgetts soaked in Afrin were placed into both nasal cavities and left while the patient was prepped and draped in the standard fashion.   The materials were removed from the nose and observed to be intact and correct in number.  The nose was inspected with a headlight and zero degree endoscope with the findings as described above.  A left Killian incision was sharply executed and carried down to the caudal edge of the quadrangular cartilage with a 15 blade scapel.  A mucoperichondrial flap was elelvated along the quadrangular plate back to the bony-cartilaginous junction using caudal elevator and freer elevator. The mucoperiostium was then elevated along the ethmoid plate and the vomer. An itracartilagenous incision was made using the freer elevator and a contralateral mucoperichondiral flap was elevated  using a freer elevator.  Care was taken to avoid any large rents or opposing rents in the mucoperichondrial flap.  Boney spurs of the vomer and maxillary crest were removed with Takahashi forceps.  The area of cartilagenous deviation was removed with combination of freer elevator and Takahashi forceps creating a widely patent nasal cavity as well as resolution of obstruction from the cartilagenous deviation. The mucosal flaps were placed back into their anatomic position to allow visualization of the airways. The septum now sat in the midline with an improved airway.  A 4-0 Chromic was used to close the [place] incision as well.   The inferior turbinates were then inspected.  Under endoscopic visualization, the inferior turbinates were infractured bilaterally with a Soil scientist.  A kelly clamp was attached to the anterior-inferior third of each inferior turbinate for approximately one minute.  Under endoscopic visualization, Tru-cutting forceps were used to remove the anterior-inferior third of each inferior turbinate.  Electrocautery was used to control bleeding in the area. The remaining turbinate was then outfractured to open up the airway further. There was no significant bleeding noted. The right turbinate was then trimmed and outfractured in a similar fashion.  The airways were then visualized and showed open passageways on both sides that were significantly improved compared to before surgery.       There was no signifcant bleeding. Nasal splints were applied to both sides of the septum using Xomed 0.18mm regular sized splints that were trimmed, and then held in position with a 3-0 Nylon through and through suture.  Stamberger sinufoam was placed along the cut edge of the inferior turbinates bilaterally.  The patient was turned back over to anesthesia, and awakened,  extubated, and taken to the PACU in satisfactory condition.  Dispo:   PACU to home  Plan: Ice, elevation, narcotic analgesia,  and prophylactic antibiotics for the duration of indwelling nasal foreign bodies.  We will reevaluate the patient in the office in 7 days and remove the septal splints.  Return to work in 10 days, strenuous activities in two weeks.   Hutchinson Isenberg 07/23/2018 8:49 AM

## 2018-07-24 ENCOUNTER — Encounter: Payer: Self-pay | Admitting: Otolaryngology

## 2018-08-01 ENCOUNTER — Ambulatory Visit: Payer: Self-pay | Admitting: Family

## 2018-08-27 ENCOUNTER — Other Ambulatory Visit (INDEPENDENT_AMBULATORY_CARE_PROVIDER_SITE_OTHER): Payer: 59

## 2018-08-27 NOTE — Addendum Note (Signed)
Addended by: Leeanne Rio on: 08/27/2018 01:32 PM   Modules accepted: Orders

## 2018-08-28 LAB — COMPREHENSIVE METABOLIC PANEL
AG Ratio: 1.7 (calc) (ref 1.0–2.5)
ALT: 8 U/L (ref 6–29)
AST: 15 U/L (ref 10–35)
Albumin: 4.3 g/dL (ref 3.6–5.1)
Alkaline phosphatase (APISO): 69 U/L (ref 33–130)
BILIRUBIN TOTAL: 0.4 mg/dL (ref 0.2–1.2)
BUN: 13 mg/dL (ref 7–25)
CALCIUM: 9.5 mg/dL (ref 8.6–10.4)
CO2: 26 mmol/L (ref 20–32)
Chloride: 105 mmol/L (ref 98–110)
Creat: 0.81 mg/dL (ref 0.50–1.05)
GLOBULIN: 2.5 g/dL (ref 1.9–3.7)
Glucose, Bld: 81 mg/dL (ref 65–99)
Potassium: 4.3 mmol/L (ref 3.5–5.3)
SODIUM: 140 mmol/L (ref 135–146)
TOTAL PROTEIN: 6.8 g/dL (ref 6.1–8.1)

## 2018-08-28 LAB — PTH, INTACT AND CALCIUM
Calcium: 9.5 mg/dL (ref 8.6–10.4)
PTH: 66 pg/mL — ABNORMAL HIGH (ref 14–64)

## 2018-08-29 ENCOUNTER — Encounter: Payer: Self-pay | Admitting: Family

## 2018-08-29 ENCOUNTER — Other Ambulatory Visit: Payer: Self-pay | Admitting: Family

## 2018-09-11 ENCOUNTER — Ambulatory Visit: Payer: 59 | Admitting: Family Medicine

## 2018-09-11 ENCOUNTER — Telehealth: Payer: Self-pay

## 2018-09-11 ENCOUNTER — Encounter: Payer: Self-pay | Admitting: Family Medicine

## 2018-09-11 VITALS — BP 130/82 | HR 85 | Temp 98.5°F | Ht 69.0 in | Wt 204.2 lb

## 2018-09-11 DIAGNOSIS — R52 Pain, unspecified: Secondary | ICD-10-CM | POA: Diagnosis not present

## 2018-09-11 DIAGNOSIS — J029 Acute pharyngitis, unspecified: Secondary | ICD-10-CM | POA: Diagnosis not present

## 2018-09-11 DIAGNOSIS — R070 Pain in throat: Secondary | ICD-10-CM

## 2018-09-11 DIAGNOSIS — R0981 Nasal congestion: Secondary | ICD-10-CM

## 2018-09-11 LAB — POCT RAPID STREP A (OFFICE): RAPID STREP A SCREEN: NEGATIVE

## 2018-09-11 MED ORDER — AZITHROMYCIN 250 MG PO TABS: ORAL_TABLET | ORAL | 0 refills | Status: DC

## 2018-09-11 NOTE — Telephone Encounter (Signed)
Copied from [facility] [id number]. Topic: Appointment Scheduling - Scheduling Inquiry for Clinic >> Sep 11, 2018  9:04 AM Sheran Luz wrote: Reason for CRM: Patient is requesting a call back, as she would like to be scheduled today at [facility] for an acute issue (sore throat)-advised we were having scheduling issues at the moment and she declined appointment at other [facility]. Please advise.

## 2018-09-11 NOTE — Progress Notes (Signed)
Subjective:    Patient ID: Heather Pope, female    DOB: May 27, 1966, 52 y.o.   MRN: [id number]  HPI  Presents to clinic c/o sore throat, feeling achy all over, congestion, nasal drainage for the past week.  Patient has been trying to help sore throat improved by taking Alka-Seltzer, using Chloraseptic spray, taking ibuprofen, resting, doing salt water gargles however all of her symptoms continue to persist.  Patient states each day she continues to feel worse.  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  Patient denies any known fever or chills, but does states she has felt feverish a few times earlier in the week.  Patient describes throat pain as if someone took sandpaper and scraped the back of her throat with it.  Patient Active Problem List   Diagnosis Date Noted  . Surgery, elective 07/11/2018  . Nausea 07/11/2018  . Vitamin D deficiency 05/17/2016  . Routine general medical examination at a health care facility 05/11/2015  . Adjustment disorder with mixed anxiety and depressed mood 05/11/2015  . Abdominal pain, right upper quadrant 05/11/2015  . Smoker 03/24/2013  . Low back pain 05/28/2012  . Lateral meniscal tear 05/28/2012   Social History   Tobacco Use  . Smoking status: Current Every Day Smoker    Packs/day: 0.25    Years: 35.00    Pack years: 8.75    Types: Cigarettes  . Smokeless tobacco: Never Used  Substance Use Topics  . Alcohol use: No   Review of Systems   Constitutional: +fatigue HENT: +sore throat, nasal congestion/drainage Eyes: Negative.   Respiratory: Negative for cough, shortness of breath and wheezing.   Cardiovascular: Negative for chest pain, palpitations and leg swelling.  Gastrointestinal: Negative for abdominal pain, diarrhea, nausea and vomiting.  Genitourinary: Negative for dysuria, frequency and urgency.  Musculoskeletal: generalized  aches and pains.  Skin: Negative for color change, pallor and rash.  Neurological: Negative for syncope, light-headedness and headaches.  Psychiatric/Behavioral: The patient is not nervous/anxious.       Objective:   Physical Exam  Constitutional: She is oriented to person, place, and time.  Non-toxic appearance.  Appears tired  HENT:  Right Ear: Ear canal normal. A middle ear effusion is present.  Left Ear: Ear canal normal. A middle ear effusion is present.  Mouth/Throat: Uvula is midline. Posterior oropharyngeal erythema present. Tonsillar exudate.  Eyes: EOM are normal.  Cardiovascular: Normal rate and normal heart sounds.  Pulmonary/Chest: Effort normal and breath sounds normal. She has no rhonchi. She has no rales.  Abdominal: Soft. Bowel sounds are normal. There is no tenderness.  Lymphadenopathy:    She has cervical adenopathy.  Neurological: She is alert and oriented to person, place, and time.  Skin: Skin is warm and dry. No pallor.  Psychiatric: She has a normal mood and affect. Her behavior is normal.  Nursing note and vitals reviewed.     Vitals:   09/11/18 1449  BP: 130/82  Pulse: 85  Temp: 98.5 F (36.9 C)  SpO2: 97%   Assessment & Plan:   Exudative pharyngitis, pain in throat, body aches, nasal congestion- rapid strep is negative in clinic, but due to exudate on tonsils in combination patient symptoms we will treat with azithromycin.  Patient is penicillin allergic.  Patient also advised she can use Tylenol alternating with Motrin for pain, she can continues to use Chloraseptic throat spray and do  salt water gargles as well.  Advised to rest, increase fluids and do good handwashing and also instructed to purchase a new toothbrush.  Patient has Flonase nasal spray at home, advised to start using this again to help treat her nasal congestion.  Patient given out of work note for tomorrow to allow her to rest.  Keep regular follow-up with PCP as planned.  Return to  clinic sooner if any issues arise.

## 2018-09-12 NOTE — Telephone Encounter (Signed)
Pt was seen yesterday office visit

## 2018-09-13 LAB — CULTURE, UPPER RESPIRATORY
MICRO NUMBER:: 91458121
SPECIMEN QUALITY: ADEQUATE

## 2018-12-15 ENCOUNTER — Other Ambulatory Visit: Payer: Self-pay | Admitting: Family

## 2018-12-18 ENCOUNTER — Other Ambulatory Visit (INDEPENDENT_AMBULATORY_CARE_PROVIDER_SITE_OTHER): Payer: 59

## 2018-12-18 ENCOUNTER — Other Ambulatory Visit: Payer: Self-pay

## 2018-12-22 LAB — PTH, INTACT AND CALCIUM
CALCIUM: 9.6 mg/dL (ref 8.6–10.4)
PTH: 58 pg/mL (ref 14–64)

## 2018-12-24 ENCOUNTER — Telehealth: Payer: Self-pay | Admitting: Family

## 2018-12-24 ENCOUNTER — Encounter: Payer: Self-pay | Admitting: Family

## 2018-12-24 LAB — VITAMIN D 1,25 DIHYDROXY
VITAMIN D 1, 25 (OH) TOTAL: 33 pg/mL (ref 18–72)
VITAMIN D3 1, 25 (OH): 33 pg/mL

## 2018-12-24 NOTE — Telephone Encounter (Signed)
LM stating that we saw that she viewed labs on mychart. I stated that if she had further questions she could call back.

## 2018-12-24 NOTE — Telephone Encounter (Signed)
Copied from [facility] [id number]. Topic: Quick Communication - See Telephone Encounter >> Dec 24, 2018 12:20 PM Vernona Rieger wrote: CRM for notification. See Telephone encounter for: 12/24/18.  Patient is requesting her lab results from 3/12

## 2018-12-24 NOTE — Telephone Encounter (Signed)
Call pt  Appears she reviewed on mychart

## 2019-01-08 MED FILL — CYCLOBENZAPRINE HCL 5 MG TA: 5 | 30 days supply | Qty: 60 | Fill #0

## 2019-01-22 ENCOUNTER — Ambulatory Visit (INDEPENDENT_AMBULATORY_CARE_PROVIDER_SITE_OTHER): Payer: 59 | Admitting: Family Medicine

## 2019-01-22 ENCOUNTER — Telehealth: Payer: 59 | Admitting: Physician Assistant

## 2019-01-22 ENCOUNTER — Other Ambulatory Visit: Payer: Self-pay

## 2019-01-22 VITALS — BP 121/76 | HR 70 | Temp 98.2°F | Resp 18

## 2019-01-22 DIAGNOSIS — J019 Acute sinusitis, unspecified: Secondary | ICD-10-CM

## 2019-01-22 DIAGNOSIS — R220 Localized swelling, mass and lump, head: Secondary | ICD-10-CM

## 2019-01-22 DIAGNOSIS — H9201 Otalgia, right ear: Secondary | ICD-10-CM

## 2019-01-22 MED ORDER — AZITHROMYCIN 250 MG PO TABS: ORAL_TABLET | ORAL | 0 refills | Status: DC

## 2019-01-22 NOTE — Progress Notes (Signed)
Based on what you shared with me, I feel your condition warrants further evaluation and I recommend that you be seen by your primary care office for a face-to-face or video visit.  Symptoms definitely could be related to sinuses but could be related to dental infection or inner ear infection which we do not treat via e-visit. These are more likely has your mucous is mostly clear (typically thick, colored and often foul-odored in sinus infections). I would recommend contacting your primary care so they can set you up for a video visit this afternoon, to make sure the most appropriate care is given.     NOTE: If you entered your credit card information for this eVisit, you will not be charged. You may see a "hold" on your card for the $35 but that hold will drop off and you will not have a charge processed.  If you are having a true medical emergency please call 911.  If you need an urgent face to face visit, [facility] has four urgent care centers for your convenience.    PLEASE NOTE: THE INSTACARE LOCATIONS AND URGENT CARE CLINICS DO NOT HAVE THE TESTING FOR CORONAVIRUS COVID19 AVAILABLE.  IF YOU FEEL YOU NEED THIS TEST YOU MUST GO TO A TRIAGE LOCATION AT [facility]   [email or web address] to reserve your spot online an avoid wait times  [facility] [address] [place], [place] [address] Modified hours of operation: Monday-Friday, 10 AM to 6 PM  Saturday & Sunday 10 AM to 4 PM *Across the street from [place] (New Address!) [address] [place], [place] [address] *Just off [address], across the road from [place] hours of operation: Monday-Friday, 10 AM to 5 PM  Closed Saturday & Sunday   The following sites will take your insurance:  . [facility] [place] a Provider at this Location  [address] [place], [place] [address] . 10 am to 8 pm Monday-Friday . 12 pm to 8 pm Saturday-Sunday   . [facility] at [place] a Provider at this Location  [address] [place], [place], [place] [address] . 8 am to 8 pm Monday-Friday . 9 am to 6 pm Saturday . 11 am to 6 pm Sunday   . [facility] at [place] Get Driving Directions  [address] [place], [place] [address] . 8 am to 8 pm Monday-Friday . 8 am to 4 pm Saturday-Sunday   Your e-visit answers were reviewed by a board certified advanced clinical practitioner to complete your personal care plan.  Thank you for using e-Visits.

## 2019-01-22 NOTE — Progress Notes (Signed)
Patient ID: Heather Pope, female   DOB: 1966/01/16, 53 y.o.   MRN: [id number]  Virtual Visit via Note  This visit type was conducted due to national recommendations for restrictions regarding the COVID-19 pandemic (e.g. social distancing).  This format is felt to be most appropriate for this patient at this time.  All issues noted in this document were discussed and addressed.  No physical exam was performed (except for noted visual exam findings with Video Visits).   I connected with Heather Pope on 01/22/19 at  2:20 PM EDT by telephone and verified that I am speaking with the correct person using two identifiers. Location patient: home Location provider: [place] Persons participating in the virtual visit: patient, provider  I discussed the limitations, risks, security and privacy concerns of performing an evaluation and management service by telephone and the availability of in person appointments. I also discussed with the patient that there may be a patient responsible charge related to this service. The patient expressed understanding and agreed to proceed.  Interactive audio and video telecommunications were attempted between this provider and patient, however failed, due to patient having technical difficulties with camera on her phone.  We continued and completed visit with audio only via phone call.  HPI:  Patient had collected via phone today due to complaints of pressure above right side of jaw up into her right ear.  Patient states the pressure has been there for approximately 4 to 5 days and seems to be getting worse.  Patient has been blowing her nose, mainly clear sometimes she will blow thick yellow especially in the mornings.  Patient states when she begins to get pain above right side of jaw, this usually indicates to her that she is developing sinus infection.  She has taken some Tylenol Sinus and Alka-Seltzer, but this has not done much to help relieve her  symptoms.  Denies fever or chills.  Denies nausea, vomiting or diarrhea.  Denies body aches.  Denies chest pain cough or shortness of breath.  Denies GI or GU issues.  ROS: See pertinent positives and negatives per HPI.  Past Medical History:  Diagnosis Date  . Arthritis    lower back  . Endometriosis   . GERD (gastroesophageal reflux disease)   . PONV (postoperative nausea and vomiting)     Past Surgical History:  Procedure Laterality Date  . ABDOMINAL HYSTERECTOMY     Dr. Vernie Ammons, one ovary in place  . BREAST SURGERY     Dr. Koleen Nimrod, normal  . NASAL SEPTOPLASTY W/ TURBINOPLASTY Bilateral 07/23/2018   Procedure: NASAL SEPTOPLASTY WITH TURBINATE REDUCTION;  Surgeon: Carloyn Manner, MD;  Location: [facility];  Service: ENT;  Laterality: Bilateral;  . SPINAL FUSION    . SPINE SURGERY  2011   L4-5, S1 fusion, Dr. Mauri Pole  . VAGINAL DELIVERY     3    Family History  Problem Relation Age of Onset  . Heart disease Mother   . Diabetes Mother   . Heart disease Maternal Grandmother   . Heart disease Maternal Grandfather        CABG  . Multiple sclerosis Daughter   . Cancer Maternal Aunt        brain  . Breast cancer Neg Hx     Social History   Tobacco Use  . Smoking status: Current Every Day Smoker    Packs/day: 0.25    Years: 35.00    Pack years: 8.75    Types: Cigarettes  .  Smokeless tobacco: Never Used  Substance Use Topics  . Alcohol use: No    Current Outpatient Medications:  .  acetaminophen (TYLENOL) 500 MG tablet, Take 500 mg by mouth every 6 (six) hours as needed. 2 tablets three times daily , Disp: , Rfl:  .  ALPRAZolam (XANAX) 0.25 MG tablet, Take 1 tablet (0.25 mg total) by mouth daily as needed for anxiety., Disp: 30 tablet, Rfl: 1 .  azithromycin (ZITHROMAX) 250 MG tablet, Take 2 tabs by mouth on day 1, take 1 tablet by mouth on days 2 through 5, Disp: 6 tablet, Rfl: 0 .  Cholecalciferol (VITAMIN D3) 2000 units TABS, Take by mouth every  other day., Disp: , Rfl:  .  cyclobenzaprine (FLEXERIL) 5 MG tablet, TAKE 1 TABLET BY MOUTH TWICE A DAY, Disp: 60 tablet, Rfl: 2 .  fluticasone (FLONASE) 50 MCG/ACT nasal spray, Place 2 sprays daily into both nostrils., Disp: 16 g, Rfl: 0 .  Multiple Vitamin (MULTIVITAMIN) tablet, Take 1 tablet by mouth daily., Disp: , Rfl:  .  ondansetron (ZOFRAN ODT) 4 MG disintegrating tablet, Take 1 tablet (4 mg total) by mouth every 8 (eight) hours as needed for nausea or vomiting., Disp: 30 tablet, Rfl: 0  EXAM:  Vitals:   01/22/19 1420  BP: 121/76  Pulse: 70  Resp: 18  Temp: 98.2 F (36.8 C)  SpO2: 99%   GENERAL: alert, oriented, sounds to be in no acute distress  LUNGS: Speaking in full sentences, breathing rate appears normal, no gasping or wheezing  PSYCH/NEURO: pleasant and cooperative, no obvious depression or anxiety, speech and thought processing grossly intact  ASSESSMENT AND PLAN:  Discussed the following assessment and plan:  Acute sinusitis, recurrence not specified, unspecified location - Plan: azithromycin (ZITHROMAX) 250 MG tablet  Due to patient's symptoms and how her sinus infections have presented in the past I do suspect she is having a sinus infection again.  She will take azithromycin course.  Z-Pak is worked well for her in the past to treat sinus infection, she is allergic to penicillins and Levaquin's.  Also advised to do saline nasal rinses and begin taking an over-the-counter antihistamine such as Claritin or Allegra to help dry up congestion and reduce inflammation in the sinuses.  Advised to avoid excess time outdoors if the pollen counts are very high to avoid additional inflammation to the sinuses.  Advised to rest, keep up good fluid intake and do good handwashing.   I discussed the assessment and treatment plan with the patient. The patient was provided an opportunity to ask questions and all were answered. The patient agreed with the plan and demonstrated an  understanding of the instructions.   The patient was advised to call back or seek an in-person evaluation if the symptoms worsen or if the condition fails to improve as anticipated.  Jodelle Green, FNP

## 2019-01-23 ENCOUNTER — Encounter: Payer: Self-pay | Admitting: Family Medicine

## 2019-02-03 ENCOUNTER — Other Ambulatory Visit: Payer: Self-pay | Admitting: Family

## 2019-02-03 DIAGNOSIS — F4323 Adjustment disorder with mixed anxiety and depressed mood: Secondary | ICD-10-CM

## 2019-02-03 NOTE — Telephone Encounter (Signed)
Refilled: 07/11/2018 Last OV: 01/22/2019 Next OV: not scheduled

## 2019-02-04 NOTE — Telephone Encounter (Signed)
Please education patient. This is controlled substance;  In order for me to prescribe medication,  Patient must be seen every 3-6 months. please make follow-up appointment.   Refilled xanax without any refills.   I looked up patient on [place] Controlled Substances Reporting System and saw no activity that raised concern of inappropriate use.

## 2019-05-14 ENCOUNTER — Other Ambulatory Visit: Payer: Self-pay | Admitting: Family

## 2019-05-14 DIAGNOSIS — F4323 Adjustment disorder with mixed anxiety and depressed mood: Secondary | ICD-10-CM

## 2019-05-14 NOTE — Telephone Encounter (Signed)
Refilled: 02/04/2019 Last OV: 01/22/2019 Next OV: not scheduled

## 2019-05-15 NOTE — Telephone Encounter (Signed)
Call pt   I have refilled your xanax  However I wanted to remind you that this is controlled substance.   In order for me to prescribe medication,  patients must be seen every 3-6 months.   Please make follow-up appointment this month for any further refills.    I looked up patient on [place] Controlled Substances Reporting System and saw no activity that raised concern of inappropriate use.

## 2019-06-12 ENCOUNTER — Other Ambulatory Visit: Payer: Self-pay

## 2019-06-12 ENCOUNTER — Ambulatory Visit (INDEPENDENT_AMBULATORY_CARE_PROVIDER_SITE_OTHER): Payer: 59 | Admitting: Family Medicine

## 2019-06-12 ENCOUNTER — Encounter: Payer: Self-pay | Admitting: Family Medicine

## 2019-06-12 DIAGNOSIS — M542 Cervicalgia: Secondary | ICD-10-CM | POA: Diagnosis not present

## 2019-06-12 DIAGNOSIS — M62838 Other muscle spasm: Secondary | ICD-10-CM | POA: Diagnosis not present

## 2019-06-12 MED ORDER — PREDNISONE 10 MG (21) PO TBPK: ORAL_TABLET | ORAL | 0 refills | Status: DC

## 2019-06-12 NOTE — Progress Notes (Signed)
Patient ID: Heather Pope, female   DOB: 1966-08-01, 53 y.o.   MRN: [id number]    Virtual Visit via video Note  This visit type was conducted due to national recommendations for restrictions regarding the COVID-19 pandemic (e.g. social distancing).  This format is felt to be most appropriate for this patient at this time.  All issues noted in this document were discussed and addressed.  No physical exam was performed (except for noted visual exam findings with Video Visits).   I connected with [name] today at 11:00 AM EDT by a video enabled telemedicine application and verified that I am speaking with the correct person using two identifiers. Location patient: home Location provider: work or home office Persons participating in the virtual visit: patient, provider  I discussed the limitations, risks, security and privacy concerns of performing an evaluation and management service by video and the availability of in person appointments. I also discussed with the patient that there may be a patient responsible charge related to this service. The patient expressed understanding and agreed to proceed.   HPI:  Patient and I connected via video to discuss right-sided neck pain/right upper arm pain.  Patient believes it is a muscle spasm/knot in her muscle.  Patient does have a history of neck and back pain and did have some Flexeril on hand, took a dose of Flexeril which did help to soothe the muscle somewhat but continues to have tightness and pain especially when turning head towards the right and lifting up right arm.  Denies fever or chills.  Denies nausea, vomiting or diarrhea.  Denies cough, shortness of breath or wheezing.  Denies full body aches.  Denies loss of grip in upper extremities.  Denies any facial droop, extremity numbness or any other neurological symptoms.   ROS: See pertinent positives and negatives per HPI.  Past Medical History:  Diagnosis Date  .  Arthritis    lower back  . Endometriosis   . GERD (gastroesophageal reflux disease)   . PONV (postoperative nausea and vomiting)     Past Surgical History:  Procedure Laterality Date  . ABDOMINAL HYSTERECTOMY     Dr. Vernie Ammons, one ovary in place  . BREAST SURGERY     Dr. Koleen Nimrod, normal  . NASAL SEPTOPLASTY W/ TURBINOPLASTY Bilateral 07/23/2018   Procedure: NASAL SEPTOPLASTY WITH TURBINATE REDUCTION;  Surgeon: Carloyn Manner, MD;  Location: [facility];  Service: ENT;  Laterality: Bilateral;  . SPINAL FUSION    . SPINE SURGERY  2011   L4-5, S1 fusion, Dr. Mauri Pole  . VAGINAL DELIVERY     3    Family History  Problem Relation Age of Onset  . Heart disease Mother   . Diabetes Mother   . Heart disease Maternal Grandmother   . Heart disease Maternal Grandfather        CABG  . Multiple sclerosis Daughter   . Cancer Maternal Aunt        brain  . Breast cancer Neg Hx    Social History   Tobacco Use  . Smoking status: Current Every Day Smoker    Packs/day: 0.25    Years: 35.00    Pack years: 8.75    Types: Cigarettes  . Smokeless tobacco: Never Used  Substance Use Topics  . Alcohol use: No    Current Outpatient Medications:  .  acetaminophen (TYLENOL) 500 MG tablet, Take 500 mg by mouth every 6 (six) hours as needed. 2 tablets three times daily , Disp: ,  Rfl:  .  ALPRAZolam (XANAX) 0.25 MG tablet, TAKE 1 TABLET (0.25 MG TOTAL) BY MOUTH DAILY AS NEEDED FOR ANXIETY., Disp: 30 tablet, Rfl: 1 .  azithromycin (ZITHROMAX) 250 MG tablet, Take 2 tabs by mouth on day 1, take 1 tablet by mouth on days 2 through 5, Disp: 6 tablet, Rfl: 0 .  Cholecalciferol (VITAMIN D3) 2000 units TABS, Take by mouth every other day., Disp: , Rfl:  .  cyclobenzaprine (FLEXERIL) 5 MG tablet, TAKE 1 TABLET BY MOUTH TWICE A DAY, Disp: 60 tablet, Rfl: 2 .  Multiple Vitamin (MULTIVITAMIN) tablet, Take 1 tablet by mouth daily., Disp: , Rfl:  .  ondansetron (ZOFRAN ODT) 4 MG disintegrating  tablet, Take 1 tablet (4 mg total) by mouth every 8 (eight) hours as needed for nausea or vomiting., Disp: 30 tablet, Rfl: 0 .  fluticasone (FLONASE) 50 MCG/ACT nasal spray, Place 2 sprays daily into both nostrils., Disp: 16 g, Rfl: 0  EXAM:  GENERAL: alert, oriented, appears well and in no acute distress  HEENT: atraumatic, conjunttiva clear, no obvious abnormalities on inspection of external nose and ears  NECK: normal movements of the head and neck  LUNGS: on inspection no signs of respiratory distress, breathing rate appears normal, no obvious gross SOB, gasping or wheezing  CV: no obvious cyanosis  MS: Able to turn head from side to side, when turning head towards the right, does do this motion slowly due to stiffness and pain.  Is able to reach both arms straight up above head, does have pain in right arm/shoulder when lifting right arm straight up due to muscle pulling and muscle tightness.  PSYCH/NEURO: pleasant and cooperative, no obvious depression or anxiety, speech and thought processing grossly intact  ASSESSMENT AND PLAN:  Discussed the following assessment and plan:  Patient will take oral steroid taper down.  She will also continue use Flexeril as needed and do topical rubs like BenGay or Biofreeze as well as heating pad to help soothe muscle pain.  Advised to be careful not have heating pad to high as we do not want to burn the skin.  Also discussed range of motion exercises with neck and arms to help loosen up muscles and keep things from becoming too stiff.  1. Muscle spasm  - predniSONE (STERAPRED UNI-PAK 21 TAB) 10 MG (21) TBPK tablet; Take according to pack instructions  Dispense: 21 tablet; Refill: 0  2. Neck pain  - predniSONE (STERAPRED UNI-PAK 21 TAB) 10 MG (21) TBPK tablet; Take according to pack instructions  Dispense: 21 tablet; Refill: 0    I discussed the assessment and treatment plan with the patient. The patient was provided an opportunity to ask  questions and all were answered. The patient agreed with the plan and demonstrated an understanding of the instructions.   The patient was advised to call back or seek an in-person evaluation if the symptoms worsen or if the condition fails to improve as anticipated.  Jodelle Green, FNP

## 2019-06-14 IMAGING — MG MM DIGITAL SCREENING BILAT W/ TOMO W/ CAD
8 series · 8 of 24 positions shown · non-contrast
Comparison: Previous exam(s).

CLINICAL DATA: Screening.

EXAM:
DIGITAL SCREENING BILATERAL MAMMOGRAM WITH TOMO AND CAD

[L CC synth-2D]
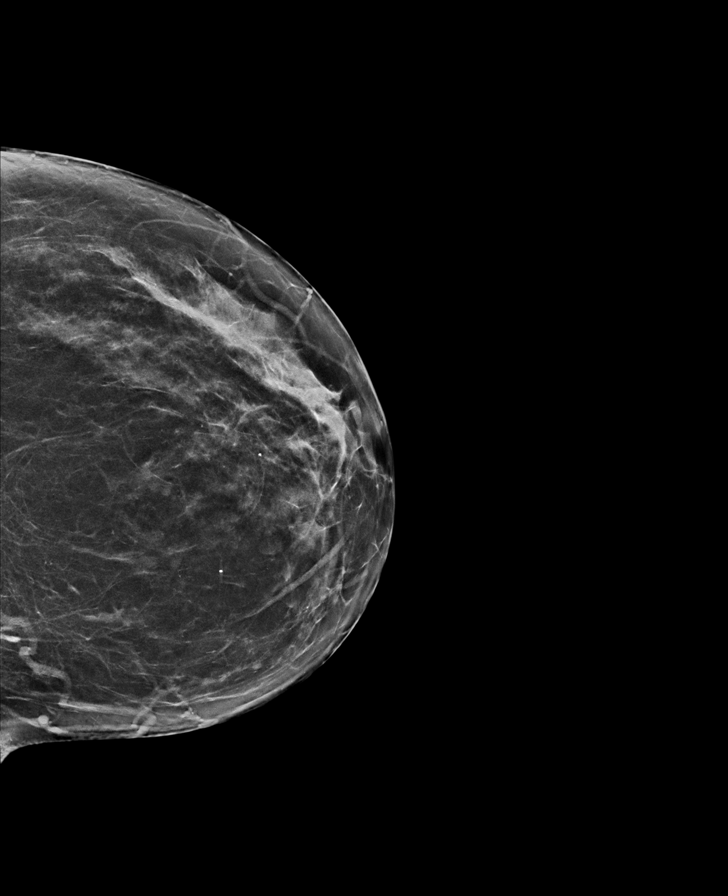

[R CC synth-2D]
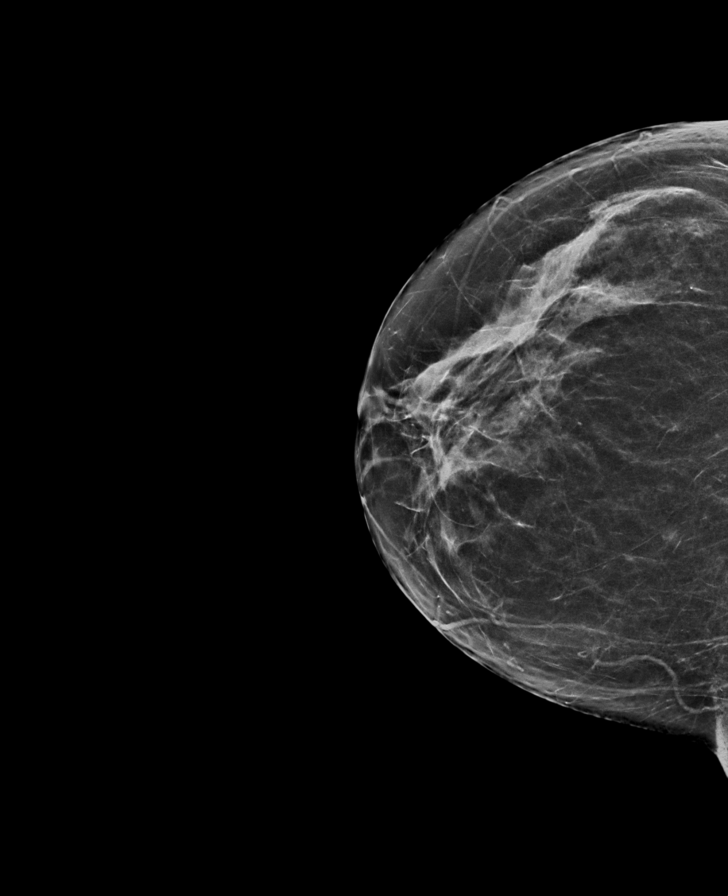

[R MLO synth-2D]
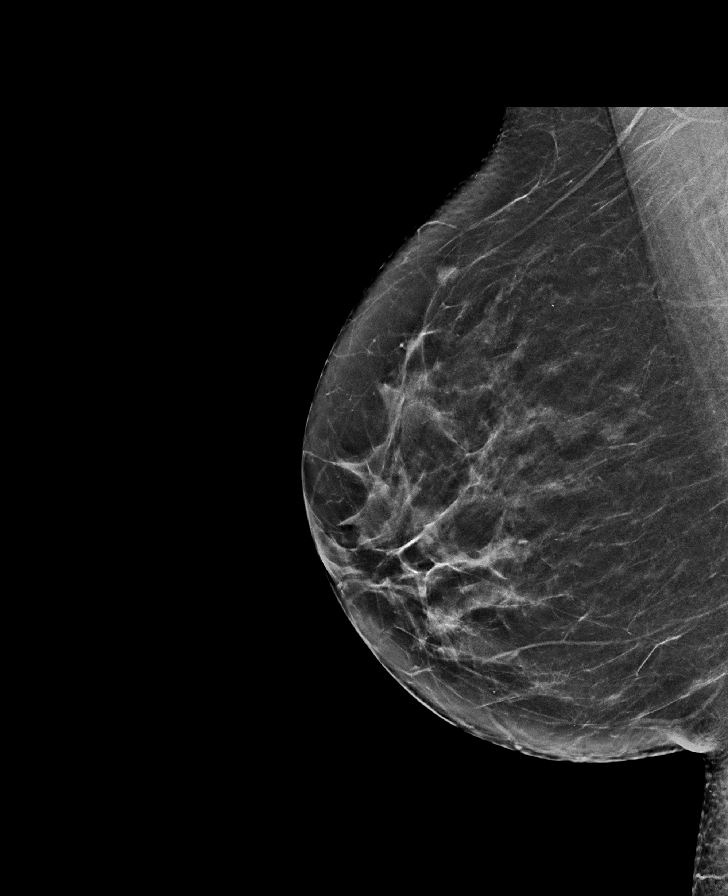

[L MLO synth-2D]
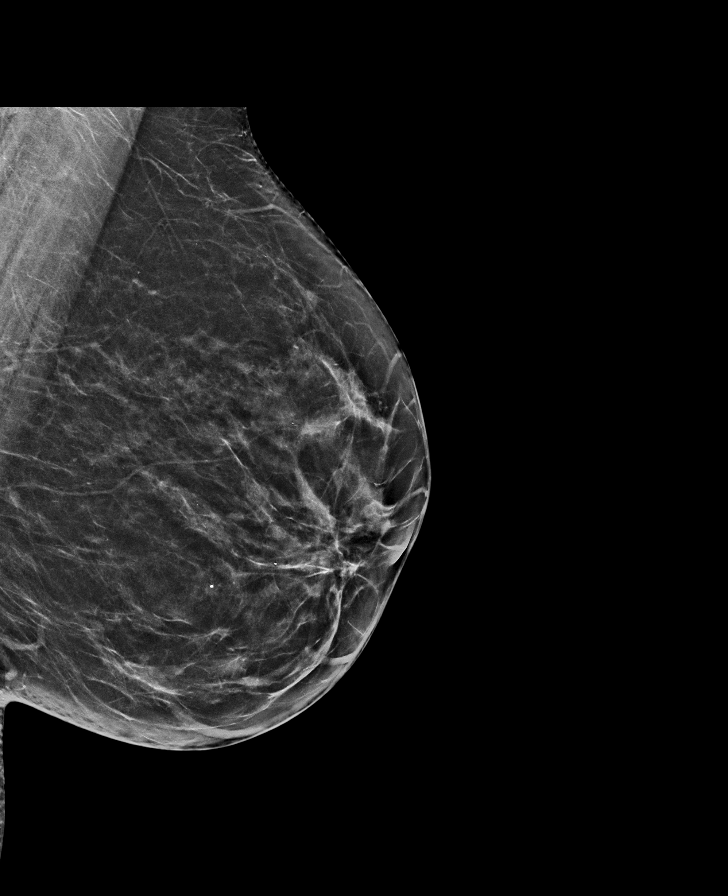

[L CC tomo · tomo slice 33/66.0]
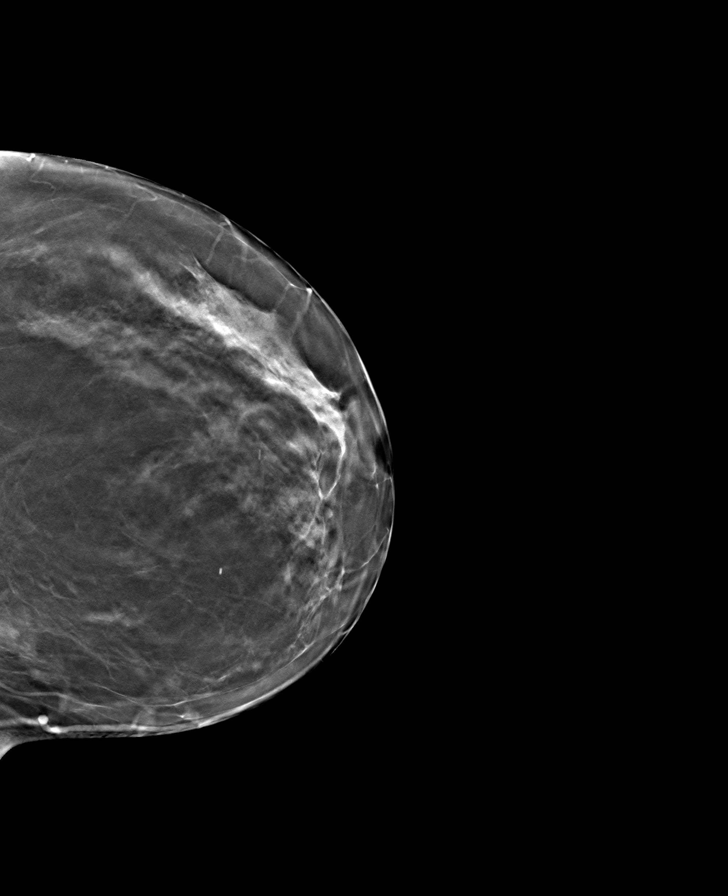

[L MLO tomo · tomo slice 33/65.0]
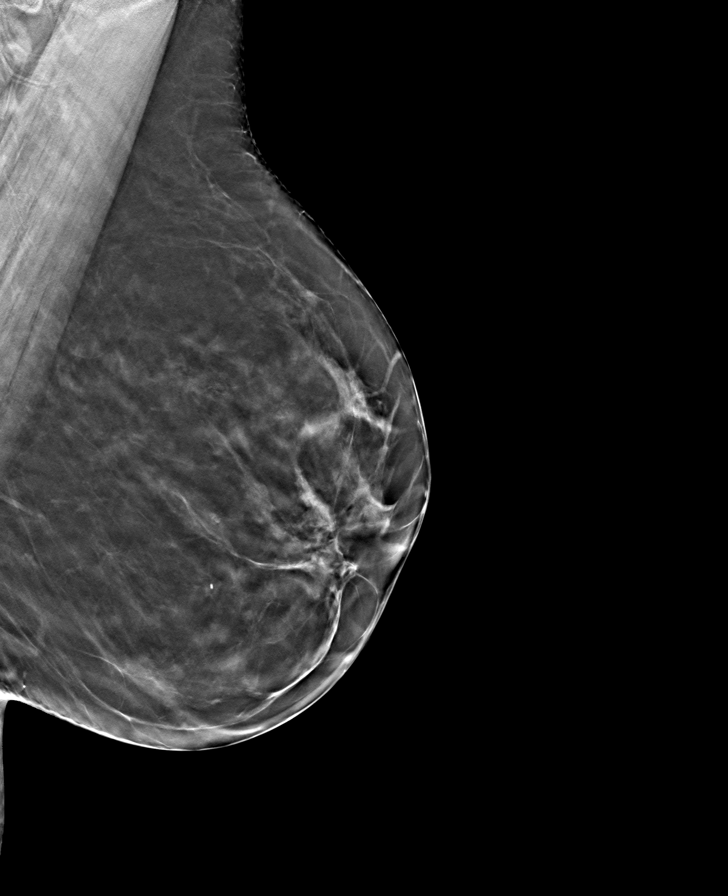

[R MLO tomo · tomo slice 33/66.0]
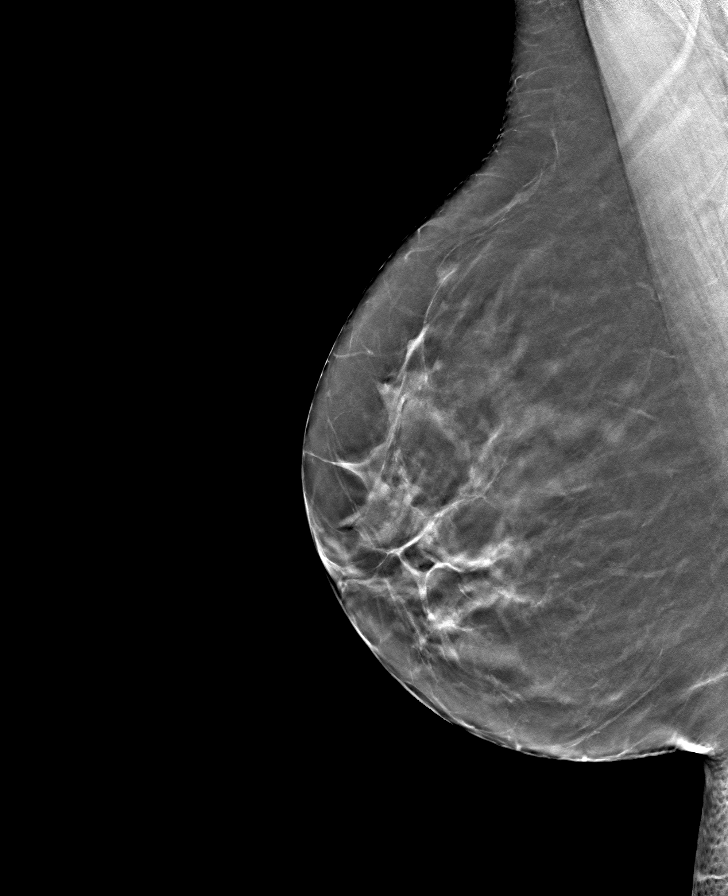

[R CC tomo · tomo slice 33/65.0]
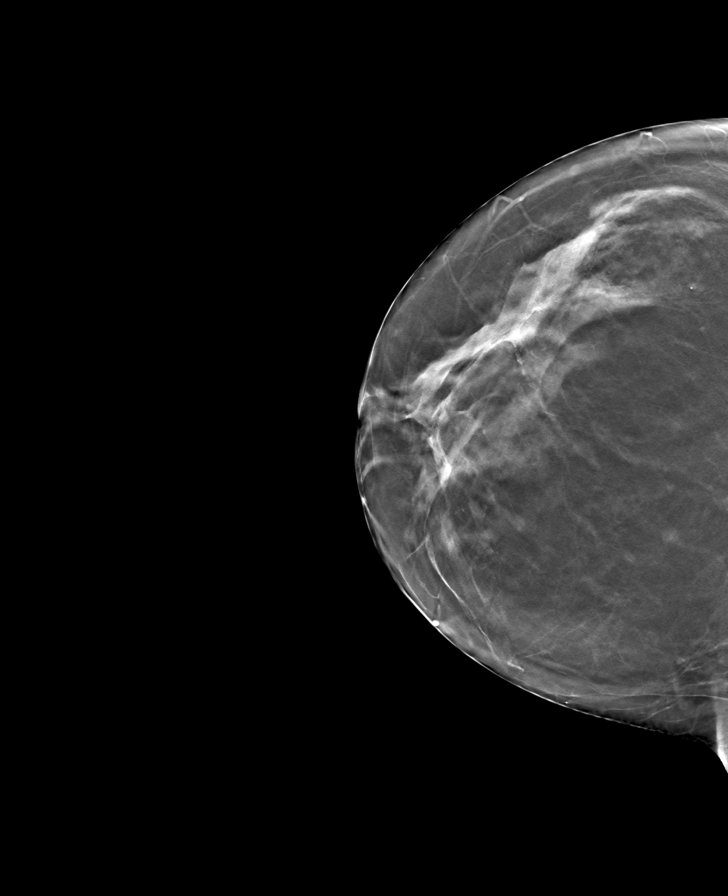

[8 of 24 positions shown; findings below may reference images not displayed]

ACR Breast Density Category c: The breast tissue is heterogeneously
dense, which may obscure small masses.
FINDINGS: There are no findings suspicious for malignancy. Images were
processed with CAD.
IMPRESSION: No mammographic evidence of malignancy. A result letter of this
screening mammogram will be mailed directly to the patient.

RECOMMENDATION:
Screening mammogram in one year. (Code:[id number]-[id number])

BI-RADS CATEGORY  1: Negative.

## 2019-07-14 ENCOUNTER — Other Ambulatory Visit: Payer: Self-pay | Admitting: Family

## 2019-07-14 DIAGNOSIS — Z1231 Encounter for screening mammogram for malignant neoplasm of breast: Secondary | ICD-10-CM

## 2019-08-04 ENCOUNTER — Other Ambulatory Visit: Payer: Self-pay

## 2019-08-04 ENCOUNTER — Ambulatory Visit
Admission: RE | Admit: 2019-08-04 | Discharge: 2019-08-04 | Disposition: A | Discharge status: Home / Self Care | Payer: 59 | Source: Ambulatory Visit | Attending: Family | Admitting: Family

## 2019-08-04 DIAGNOSIS — Z1231 Encounter for screening mammogram for malignant neoplasm of breast: Secondary | ICD-10-CM | POA: Insufficient documentation

## 2019-10-09 ENCOUNTER — Encounter: Payer: Self-pay | Admitting: Family

## 2019-10-16 DIAGNOSIS — Z23 Encounter for immunization: Secondary | ICD-10-CM | POA: Diagnosis not present

## 2019-11-13 DIAGNOSIS — Z23 Encounter for immunization: Secondary | ICD-10-CM | POA: Diagnosis not present

## 2019-12-07 ENCOUNTER — Other Ambulatory Visit: Payer: Self-pay | Admitting: Family

## 2019-12-07 DIAGNOSIS — F4323 Adjustment disorder with mixed anxiety and depressed mood: Secondary | ICD-10-CM

## 2019-12-07 NOTE — Telephone Encounter (Signed)
Call pt   I have refilled your xanax  However I wanted to remind you that this is controlled substance.   In order for me to prescribe medication,  patients must be seen every 3 months.   Please make follow-up appointment this month for any further refills.    I looked up patient on [place] Controlled Substances Reporting System and saw no activity that raised concern of inappropriate use.   

## 2019-12-07 NOTE — Telephone Encounter (Signed)
Refill request for 12-24-18, last seen 05-15-19, last filled.  Please advise.

## 2019-12-14 ENCOUNTER — Ambulatory Visit
Admission: EM | Admit: 2019-12-14 | Discharge: 2019-12-14 | Disposition: A | Discharge status: Home / Self Care | Payer: 59 | Attending: Family Medicine | Admitting: Family Medicine

## 2019-12-14 ENCOUNTER — Other Ambulatory Visit: Payer: Self-pay

## 2019-12-14 ENCOUNTER — Telehealth: Payer: Self-pay | Admitting: Family

## 2019-12-14 DIAGNOSIS — H6022 Malignant otitis externa, left ear: Secondary | ICD-10-CM

## 2019-12-14 MED ORDER — NEOMYCIN-POLYMYXIN-HC 3.5-10000-1 OT SUSP: 4.0000 [drp] | Freq: Three times a day (TID) | OTIC | 0 refills | Status: DC

## 2019-12-14 NOTE — ED Triage Notes (Signed)
Patient complains of left ear pain that started yesterday. Patient states that she tried to clean out her ear yesterday using sweet oil but made the pain worse. Patient states that she has noticed some popping in her right ear.

## 2019-12-14 NOTE — Telephone Encounter (Signed)
Pt called in and wanted to be seen today for ear infection. She said her left ear is hurting. I was going to try to get her seen at [facility] but she said she lives in [place] and at work in [place]. I then suggested the [facility] across the street. She said she will try the one in [place] since we didn't have any appts today.

## 2019-12-14 NOTE — Telephone Encounter (Signed)
I called and spoke with patient to make sure that she was going to seek care somewhere. 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

## 2019-12-14 NOTE — ED Provider Notes (Signed)
[facility]    CSN: [id number] Arrival date & time: 12/14/19  1515      History   Chief Complaint Chief Complaint  Patient presents with  . Otalgia    left    HPI Heather Pope is a 54 y.o. female.   54 yo female with a c/o left ear pain since yesterday. States she tried to clean it out with sweet oil yesterday. Denies any drainage, fevers, chills, injury.      Otalgia   Past Medical History:  Diagnosis Date  . Arthritis    lower back  . Endometriosis   . GERD (gastroesophageal reflux disease)   . PONV (postoperative nausea and vomiting)     Patient Active Problem List   Diagnosis Date Noted  . Surgery, elective 07/11/2018  . Nausea 07/11/2018  . Vitamin D deficiency 05/17/2016  . Routine general medical examination at a health care facility 05/11/2015  . Adjustment disorder with mixed anxiety and depressed mood 05/11/2015  . Abdominal pain, right upper quadrant 05/11/2015  . Smoker 03/24/2013  . Low back pain 05/28/2012  . Lateral meniscal tear 05/28/2012    Past Surgical History:  Procedure Laterality Date  . ABDOMINAL HYSTERECTOMY     Dr. Vernie Ammons, one ovary in place  . BREAST SURGERY     Dr. Koleen Nimrod, normal  . NASAL SEPTOPLASTY W/ TURBINOPLASTY Bilateral 07/23/2018   Procedure: NASAL SEPTOPLASTY WITH TURBINATE REDUCTION;  Surgeon: Carloyn Manner, MD;  Location: [facility];  Service: ENT;  Laterality: Bilateral;  . SPINAL FUSION    . SPINE SURGERY  2011   L4-5, S1 fusion, Dr. Mauri Pole  . VAGINAL DELIVERY     3    OB History   No obstetric history on file.      Home Medications    Prior to Admission medications   Medication Sig Start Date End Date Taking? Authorizing Provider  acetaminophen (TYLENOL) 500 MG tablet Take 500 mg by mouth every 6 (six) hours as needed. 2 tablets three times daily    Yes [provider]  ALPRAZolam (XANAX) 0.25 MG tablet TAKE 1 TABLET (0.25 MG TOTAL) BY MOUTH DAILY AS  NEEDED FOR ANXIETY. 12/07/19  Yes Burnard Hawthorne, FNP  cyclobenzaprine (FLEXERIL) 5 MG tablet TAKE 1 TABLET BY MOUTH TWICE A DAY 12/16/18  Yes Burnard Hawthorne, FNP  Multiple Vitamin (MULTIVITAMIN) tablet Take 1 tablet by mouth daily.   Yes [provider]  azithromycin (ZITHROMAX) 250 MG tablet Take 2 tabs by mouth on day 1, take 1 tablet by mouth on days 2 through 5 01/22/19   Guse, Jacquelynn Cree, FNP  Cholecalciferol (VITAMIN D3) 2000 units TABS Take by mouth every other day.    [provider]  fluticasone (FLONASE) 50 MCG/ACT nasal spray Place 2 sprays daily into both nostrils. 08/24/17   Lorin Picket, PA-C  neomycin-polymyxin-hydrocortisone (CORTISPORIN) 3.5-10000-1 OTIC suspension Place 4 drops into the left ear 3 (three) times daily. 12/14/19   Norval Gable, MD  ondansetron (ZOFRAN ODT) 4 MG disintegrating tablet Take 1 tablet (4 mg total) by mouth every 8 (eight) hours as needed for nausea or vomiting. 07/11/18   Burnard Hawthorne, FNP  predniSONE (STERAPRED UNI-PAK 21 TAB) 10 MG (21) TBPK tablet Take according to pack instructions 06/12/19   Jodelle Green, FNP    Family History Family History  Problem Relation Age of Onset  . Heart disease Mother   . Diabetes Mother   . Heart disease Maternal  Grandmother   . Heart disease Maternal Grandfather        CABG  . Multiple sclerosis Daughter   . Cancer Maternal Aunt        brain  . Breast cancer Neg Hx     Social History Social History   Tobacco Use  . Smoking status: Current Every Day Smoker    Packs/day: 0.25    Years: 35.00    Pack years: 8.75    Types: Cigarettes  . Smokeless tobacco: Never Used  Substance Use Topics  . Alcohol use: No  . Drug use: No     Allergies   Ibuprofen, Levaquin [levofloxacin in d5w], Oxycodone, and Penicillins   Review of Systems Review of Systems  HENT: Positive for ear pain.      Physical Exam Triage Vital Signs ED Triage Vitals  Enc Vitals Group     BP  12/14/19 1530 115/84     Pulse Rate 12/14/19 1530 68     Resp 12/14/19 1530 18     Temp 12/14/19 1530 97.9 F (36.6 C)     Temp Source 12/14/19 1530 Oral     SpO2 12/14/19 1530 100 %     Weight 12/14/19 1528 205 lb (93 kg)     Height 12/14/19 1528 5\' 9"  (1.753 m)     Head Circumference --      Peak Flow --      Pain Score 12/14/19 1528 4     Pain Loc --      Pain Edu? --      Excl. in [place]? --    No data found.  Updated Vital Signs BP 115/84 (BP Location: Right Arm)   Pulse 68   Temp 97.9 F (36.6 C) (Oral)   Resp 18   Ht 5\' 9"  (1.753 m)   Wt 93 kg   SpO2 100%   BMI 30.27 kg/m   Visual Acuity Right Eye Distance:   Left Eye Distance:   Bilateral Distance:    Right Eye Near:   Left Eye Near:    Bilateral Near:     Physical Exam Vitals and nursing note reviewed.  Constitutional:      General: She is not in acute distress.    Appearance: She is not toxic-appearing or diaphoretic.  HENT:     Right Ear: Tympanic membrane, ear canal and external ear normal. There is no impacted cerumen.     Left Ear: Tympanic membrane and external ear normal. Swelling present. There is no impacted cerumen.     Ears:     Comments: Left ear canal with edema and erythema; no drainage Musculoskeletal:     Cervical back: Neck supple.  Neurological:     Mental Status: She is alert.      UC Treatments / Results  Labs (all labs ordered are listed, but only abnormal results are displayed) Labs Reviewed - No data to display  EKG   Radiology No results found.  Procedures Procedures (including critical care time)  Medications Ordered in UC Medications - No data to display  Initial Impression / Assessment and Plan / UC Course  I have reviewed the triage vital signs and the nursing notes.  Pertinent labs & imaging results that were available during my care of the patient were reviewed by me and considered in my medical decision making (see chart for details).      Final  Clinical Impressions(s) / UC Diagnoses   Final diagnoses:  Acute malignant otitis  externa of left ear    ED Prescriptions    Medication Sig Dispense Auth. Provider   neomycin-polymyxin-hydrocortisone (CORTISPORIN) 3.5-10000-1 OTIC suspension Place 4 drops into the left ear 3 (three) times daily. 10 mL Norval Gable, MD      1. diagnosis reviewed with patient 2. rx as per orders above; reviewed possible side effects, interactions, risks and benefits  3. Recommend supportive treatment otc analgesics prn  4. Follow-up prn if symptoms worsen or don't improve   PDMP not reviewed this encounter.   Norval Gable, MD 12/14/19 [phone]

## 2019-12-17 ENCOUNTER — Encounter: Payer: Self-pay | Admitting: Family

## 2019-12-18 NOTE — Telephone Encounter (Signed)
Mychart message sent to patient.

## 2020-01-15 ENCOUNTER — Telehealth: Payer: Self-pay | Admitting: Family

## 2020-01-15 ENCOUNTER — Ambulatory Visit (INDEPENDENT_AMBULATORY_CARE_PROVIDER_SITE_OTHER): Payer: 59

## 2020-01-15 ENCOUNTER — Encounter: Payer: Self-pay | Admitting: Family

## 2020-01-15 ENCOUNTER — Ambulatory Visit (INDEPENDENT_AMBULATORY_CARE_PROVIDER_SITE_OTHER): Payer: 59 | Admitting: Family

## 2020-01-15 ENCOUNTER — Other Ambulatory Visit: Payer: Self-pay

## 2020-01-15 VITALS — BP 110/70 | HR 68 | Temp 97.1°F | Ht 68.0 in | Wt 212.6 lb

## 2020-01-15 DIAGNOSIS — Z0000 Encounter for general adult medical examination without abnormal findings: Secondary | ICD-10-CM | POA: Diagnosis not present

## 2020-01-15 DIAGNOSIS — R140 Abdominal distension (gaseous): Secondary | ICD-10-CM | POA: Insufficient documentation

## 2020-01-15 DIAGNOSIS — F419 Anxiety disorder, unspecified: Secondary | ICD-10-CM

## 2020-01-15 DIAGNOSIS — M542 Cervicalgia: Secondary | ICD-10-CM | POA: Diagnosis not present

## 2020-01-15 DIAGNOSIS — R109 Unspecified abdominal pain: Secondary | ICD-10-CM | POA: Diagnosis not present

## 2020-01-15 LAB — LIPID PANEL
Cholesterol: 169 mg/dL (ref 0–200)
HDL: 57.5 mg/dL (ref >39.00)
LDL Cholesterol: 95 mg/dL (ref 0–99)
NonHDL: 111.51
Total CHOL/HDL Ratio: 3
Triglycerides: 81 mg/dL (ref 0.0–149.0)
VLDL: 16.2 mg/dL (ref 0.0–40.0)

## 2020-01-15 LAB — COMPREHENSIVE METABOLIC PANEL
ALT: 10 U/L (ref 0–35)
AST: 14 U/L (ref 0–37)
Albumin: 4.3 g/dL (ref 3.5–5.2)
Alkaline Phosphatase: 53 U/L (ref 39–117)
BUN: 11 mg/dL (ref 6–23)
CO2: 27 mEq/L (ref 19–32)
Calcium: 9.3 mg/dL (ref 8.4–10.5)
Chloride: 104 mEq/L (ref 96–112)
Creatinine, Ser: 0.76 mg/dL (ref 0.40–1.20)
GFR: 78.85 mL/min (ref >60.00)
Glucose, Bld: 91 mg/dL (ref 70–99)
Potassium: 4.1 mEq/L (ref 3.5–5.1)
Sodium: 139 mEq/L (ref 135–145)
Total Bilirubin: 0.5 mg/dL (ref 0.2–1.2)
Total Protein: 6.7 g/dL (ref 6.0–8.3)

## 2020-01-15 LAB — CBC WITH DIFFERENTIAL/PLATELET
Basophils Absolute: 0.1 10*3/uL (ref 0.0–0.1)
Basophils Relative: 0.8 % (ref 0.0–3.0)
Eosinophils Absolute: 0.1 10*3/uL (ref 0.0–0.7)
Eosinophils Relative: 1.9 % (ref 0.0–5.0)
HCT: 43.4 % (ref 36.0–46.0)
Hemoglobin: 14.2 g/dL (ref 12.0–15.0)
Lymphocytes Relative: 26.6 % (ref 12.0–46.0)
Lymphs Abs: 1.9 10*3/uL (ref 0.7–4.0)
MCHC: 32.8 g/dL (ref 30.0–36.0)
MCV: 88.9 fl (ref 78.0–100.0)
Monocytes Absolute: 0.4 10*3/uL (ref 0.1–1.0)
Monocytes Relative: 5.1 % (ref 3.0–12.0)
Neutro Abs: 4.8 10*3/uL (ref 1.4–7.7)
Neutrophils Relative %: 65.6 % (ref 43.0–77.0)
Platelets: 253 10*3/uL (ref 150.0–400.0)
RBC: 4.88 Mil/uL (ref 3.87–5.11)
RDW: 14.5 % (ref 11.5–15.5)
WBC: 7.3 10*3/uL (ref 4.0–10.5)

## 2020-01-15 LAB — HEMOGLOBIN A1C: Hgb A1c MFr Bld: 5.5 % (ref 4.6–6.5)

## 2020-01-15 LAB — VITAMIN D 25 HYDROXY (VIT D DEFICIENCY, FRACTURES): VITD: 21.75 ng/mL — ABNORMAL LOW (ref 30.00–100.00)

## 2020-01-15 LAB — TSH: TSH: 3.06 u[IU]/mL (ref 0.35–4.50)

## 2020-01-15 MED ORDER — FAMOTIDINE 10 MG PO TABS: 10.0000 mg | ORAL_TABLET | Freq: Two times a day (BID) | ORAL | 2 refills | Status: DC

## 2020-01-15 NOTE — Progress Notes (Signed)
Subjective:    Patient ID: Heather Pope, female    DOB: 09/11/1966, 54 y.o.   MRN: [id number]  CC: Heather Pope is a 54 y.o. female who presents today for physical exam.    HPI: Complains of left sided posterior neck pain x 3-4 months, unchanged. Comes and goes.  Pain with turning to right. Pain improves with rest.  No left shoulder pain, arm numbness or weakness, fever, trouble having BM, urinary retention, HA, vision changes.   Using icy hot, ice with relief.  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   No h/o cancer.   Complains of abdominal bloating off and on for 8-9 months.  Endorses epigastric burning, bad taste in mouth. Endores weight gain ( approx 10 lbs),  Started eating more salads and stopped drinking diet pepsi with some improvement. No constipation.  Has started to take pepcid once daily one month ago.    GAD- controlled. rare as needed use of xanax    Colorectal Cancer Screening: due Breast Cancer Screening: Mammogram UTD Cervical Cancer Screening: h/o hysterectomy for endometriosis ; one ovary in place ( unsure which side), NO cervix per patient. No h/o abnormal pap smear. No vaginal bleeding.  Bone Health screening/DEXA for 65+: No increased fracture risk. Defer screening at this time. Lung Cancer Screening:due         Tetanus - utd       Labs: Screening labs today. Exercise: Plans to start walking.  Denies exertional chest pain or pressure, numbness or tingling radiating to left arm or jaw, palpitations, dizziness, frequent headaches, changes in vision, or shortness of breath when walking or working.   Alcohol use: none Smoking/tobacco use: smoker.   HISTORY:  Past Medical History:  Diagnosis Date  . Arthritis    lower back  . Endometriosis   . GERD (gastroesophageal reflux disease)   . PONV (postoperative nausea and vomiting)     Past Surgical History:  Procedure Laterality Date  . ABDOMINAL HYSTERECTOMY     for endometriosis; Dr.  Vernie Ammons, one ovary in place ( unsure which side), NO cervix per patient. No h/o abnormal pap smear  . BREAST SURGERY     Dr. Koleen Nimrod, normal  . NASAL SEPTOPLASTY W/ TURBINOPLASTY Bilateral 07/23/2018   Procedure: NASAL SEPTOPLASTY WITH TURBINATE REDUCTION;  Surgeon: Carloyn Manner, MD;  Location: [facility];  Service: ENT;  Laterality: Bilateral;  . SPINAL FUSION    . SPINE SURGERY  2011   L4-5, S1 fusion, Dr. Mauri Pole  . VAGINAL DELIVERY     3   Family History  Problem Relation Age of Onset  . Heart disease Mother   . Diabetes Mother   . Heart disease Maternal Grandmother   . Heart disease Maternal Grandfather        CABG  . Multiple sclerosis Daughter   . Cancer Maternal Aunt        brain  . Breast cancer Neg Hx       ALLERGIES: Ibuprofen, Levaquin [levofloxacin in d5w], Oxycodone, and Penicillins  Current Outpatient Medications on File Prior to Visit  Medication Sig Dispense Refill  . acetaminophen (TYLENOL) 500 MG tablet Take 500 mg by mouth every 6 (six) hours as needed. 2 tablets three times daily     . ALPRAZolam (XANAX) 0.25 MG tablet TAKE 1 TABLET (0.25 MG TOTAL) BY MOUTH DAILY AS NEEDED FOR ANXIETY. 30 tablet 0  . Cholecalciferol (VITAMIN D3) 2000 units TABS Take by mouth every other day.    Marland Kitchen  cyclobenzaprine (FLEXERIL) 5 MG tablet TAKE 1 TABLET BY MOUTH TWICE A DAY 60 tablet 2  . Multiple Vitamin (MULTIVITAMIN) tablet Take 1 tablet by mouth daily.    . fluticasone (FLONASE) 50 MCG/ACT nasal spray Place 2 sprays daily into both nostrils. 16 g 0  . neomycin-polymyxin-hydrocortisone (CORTISPORIN) 3.5-10000-1 OTIC suspension Place 4 drops into the left ear 3 (three) times daily. 10 mL 0   No current facility-administered medications on file prior to visit.    Social History   Tobacco Use  . Smoking status: Current Every Day Smoker    Packs/day: 0.25    Years: 35.00    Pack years: 8.75    Types: Cigarettes  . Smokeless tobacco: Never Used  Substance  Use Topics  . Alcohol use: No  . Drug use: No    Review of Systems  Constitutional: Negative for chills, fever and unexpected weight change.  HENT: Negative for congestion.   Eyes: Negative for visual disturbance.  Respiratory: Negative for cough and shortness of breath.   Cardiovascular: Negative for chest pain, palpitations and leg swelling.  Gastrointestinal: Positive for abdominal distention. Negative for constipation, diarrhea, nausea and vomiting.  Musculoskeletal: Positive for neck pain and neck stiffness. Negative for arthralgias and myalgias.  Skin: Negative for rash.  Neurological: Negative for numbness and headaches.  Hematological: Negative for adenopathy.  Psychiatric/Behavioral: Negative for confusion.      Objective:    BP 110/70   Pulse 68   Temp (!) 97.1 F (36.2 C)   Ht 5\' 8"  (1.727 m)   Wt 212 lb 9.6 oz (96.4 kg)   SpO2 98%   BMI 32.33 kg/m   BP Readings from Last 3 Encounters:  01/15/20 110/70  12/14/19 115/84  01/22/19 121/76   Wt Readings from Last 3 Encounters:  01/15/20 212 lb 9.6 oz (96.4 kg)  12/14/19 205 lb (93 kg)  09/11/18 204 lb 3.2 oz (92.6 kg)    Physical Exam Vitals reviewed.  Constitutional:      Appearance: Normal appearance. She is well-developed.  Eyes:     Conjunctiva/sclera: Conjunctivae normal.  Neck:     Thyroid: No thyroid mass or thyromegaly.     Comments: Normal  upper extremity strength.  Normal grip strength.  No numbness Cardiovascular:     Rate and Rhythm: Normal rate and regular rhythm.     Pulses: Normal pulses.     Heart sounds: Normal heart sounds.  Pulmonary:     Effort: Pulmonary effort is normal.     Breath sounds: Normal breath sounds. No wheezing, rhonchi or rales.  Chest:     Breasts: Breasts are symmetrical.        Right: No inverted nipple, mass, nipple discharge, skin change or tenderness.        Left: No inverted nipple, mass, nipple discharge, skin change or tenderness.  Abdominal:      General: Bowel sounds are normal. There is no distension.     Palpations: Abdomen is soft. Abdomen is not rigid. There is no fluid wave or mass.     Tenderness: There is no abdominal tenderness. There is no guarding or rebound.  Musculoskeletal:     Cervical back: Full passive range of motion without pain and normal range of motion. No edema, rigidity or torticollis.  Lymphadenopathy:     Head:     Right side of head: No submental, submandibular, tonsillar, preauricular, posterior auricular or occipital adenopathy.     Left side of head: No submental,  submandibular, tonsillar, preauricular, posterior auricular or occipital adenopathy.     Cervical: No cervical adenopathy.     Right cervical: No superficial, deep or posterior cervical adenopathy.    Left cervical: No superficial, deep or posterior cervical adenopathy.  Skin:    General: Skin is warm and dry.  Neurological:     Mental Status: She is alert.  Psychiatric:        Speech: Speech normal.        Behavior: Behavior normal.        Thought Content: Thought content normal.        Assessment & Plan:   Problem List Items Addressed This Visit      Other   Abdominal bloating    Working diagnosis of GERD. Trial pepcid ac BID. Pending CT abdomen pelvic, Ca 125 to ensure no ovarian pathology      Relevant Medications   famotidine (PEPCID AC) 10 MG tablet   Other Relevant Orders   CT ABDOMEN PELVIS W CONTRAST   CA 125   Anxiety    Stable at baseline.  Using Xanax as needed.  We will continue      Neck pain    Suspect degenerative disc disease.  Pending x-ray.  Advised patient to use conservative therapy including Tylenol arthritis, heat.  She will let me know how she is doing      Relevant Orders   DG Cervical Spine Complete   Routine physical examination - Primary    Declines pelvic exam in the absence of complaints and she is no longer doing pap screening as she does not have a cervix.  Ordered CT lung cancer screening  and colonoscopy for patient.  Screening labs ordered.  Encouraged to get back to more regular walking program      Relevant Medications   famotidine (PEPCID AC) 10 MG tablet   Other Relevant Orders   Ambulatory referral to Gastroenterology   CT CHEST LUNG CA SCREEN LOW DOSE W/O CM   DG Cervical Spine Complete   CT ABDOMEN PELVIS W CONTRAST   TSH   CBC with Differential/Platelet   Comprehensive metabolic panel   Hemoglobin A1c   Lipid panel   VITAMIN D 25 Hydroxy (Vit-D Deficiency, Fractures)   CA 125    Other Visit Diagnoses    Abdominal pain, unspecified abdominal location       Relevant Orders   CT ABDOMEN PELVIS W CONTRAST   CA 125       I have discontinued Cythia L. Koci's ondansetron, azithromycin, and predniSONE. I am also having her start on famotidine. Additionally, I am having her maintain her acetaminophen, fluticasone, multivitamin, Vitamin D3, cyclobenzaprine, ALPRAZolam, and neomycin-polymyxin-hydrocortisone.   Meds ordered this encounter  Medications  . famotidine (PEPCID AC) 10 MG tablet    Sig: Take 1 tablet (10 mg total) by mouth 2 (two) times daily.    Dispense:  60 tablet    Refill:  2    Order Specific Question:   Supervising Provider    Answer:   Crecencio Mc [[id number]]    Return precautions given.   Risks, benefits, and alternatives of the medications and treatment plan prescribed today were discussed, and patient expressed understanding.   Education regarding symptom management and diagnosis given to patient on AVS.   Continue to follow with Burnard Hawthorne, FNP for routine health maintenance.   Heather Pope and I agreed with plan.   Mable Paris, FNP

## 2020-01-15 NOTE — Assessment & Plan Note (Signed)
Stable at baseline.  Using Xanax as needed.  We will continue

## 2020-01-15 NOTE — Addendum Note (Signed)
Addended by: Burnard Hawthorne on: 01/15/2020 11:39 AM   Modules accepted: Orders

## 2020-01-15 NOTE — Assessment & Plan Note (Signed)
Declines pelvic exam in the absence of complaints and she is no longer doing pap screening as she does not have a cervix.  Ordered CT lung cancer screening and colonoscopy for patient.  Screening labs ordered.  Encouraged to get back to more regular walking program

## 2020-01-15 NOTE — Assessment & Plan Note (Signed)
Suspect degenerative disc disease.  Pending x-ray.  Advised patient to use conservative therapy including Tylenol arthritis, heat.  She will let me know how she is doing

## 2020-01-15 NOTE — Assessment & Plan Note (Signed)
Working diagnosis of GERD. Trial pepcid ac BID. Pending CT abdomen pelvic, Ca 125 to ensure no ovarian pathology

## 2020-01-15 NOTE — Patient Instructions (Addendum)
I have ordered a CT lung cancer screening test for you,  Colonoscopy, please call us in one week if you will hear from [place] in regards to these  tests being scheduled.  Trial pepcid ac  Health Maintenance, Female Adopting a healthy lifestyle and getting preventive care are important in promoting health and wellness. Ask your health care provider about:  The right schedule for you to have regular tests and exams.  Things you can do on your own to prevent diseases and keep yourself healthy. What should I know about diet, weight, and exercise? Eat a healthy diet   Eat a diet that includes plenty of vegetables, fruits, low-fat dairy products, and lean protein.  Do not eat a lot of foods that are high in solid fats, added sugars, or sodium. Maintain a healthy weight Body mass index (BMI) is used to identify weight problems. It estimates body fat based on height and weight. Your health care provider can help determine your BMI and help you achieve or maintain a healthy weight. Get regular exercise Get regular exercise. This is one of the most important things you can do for your health. Most adults should:  Exercise for at least 150 minutes each week. The exercise should increase your heart rate and make you sweat (moderate-intensity exercise).  Do strengthening exercises at least twice a week. This is in addition to the moderate-intensity exercise.  Spend less time sitting. Even light physical activity can be beneficial. Watch cholesterol and blood lipids Have your blood tested for lipids and cholesterol at 56 years of age, then have this test every 5 years. Have your cholesterol levels checked more often if:  Your lipid or cholesterol levels are high.  You are older than 56 years of age.  You are at high risk for heart disease. What should I know about cancer screening? Depending on your health history and family history, you may need to have cancer screening at various ages. This may  include screening for:  Breast cancer.  Cervical cancer.  Colorectal cancer.  Skin cancer.  Lung cancer. What should I know about heart disease, diabetes, and high blood pressure? Blood pressure and heart disease  High blood pressure causes heart disease and increases the risk of stroke. This is more likely to develop in people who have high blood pressure readings, are of African descent, or are overweight.  Have your blood pressure checked: ? Every 3-5 years if you are 38-76 years of age. ? Every year if you are 18 years old or older. Diabetes Have regular diabetes screenings. This checks your fasting blood sugar level. Have the screening done:  Once every three years after age 72 if you are at a normal weight and have a low risk for diabetes.  More often and at a younger age if you are overweight or have a high risk for diabetes. What should I know about preventing infection? Hepatitis B If you have a higher risk for hepatitis B, you should be screened for this virus. Talk with your health care provider to find out if you are at risk for hepatitis B infection. Hepatitis C Testing is recommended for:  Everyone born from [birth year] through 1967.  Anyone with known risk factors for hepatitis C. Sexually transmitted infections (STIs)  Get screened for STIs, including gonorrhea and chlamydia, if: ? You are sexually active and are younger than 56 years of age. ? You are older than 56 years of age and your health care provider tells  you that you are at risk for this type of infection. ? Your sexual activity has changed since you were last screened, and you are at increased risk for chlamydia or gonorrhea. Ask your health care provider if you are at risk.  Ask your health care provider about whether you are at high risk for HIV. Your health care provider may recommend a prescription medicine to help prevent HIV infection. If you choose to take medicine to prevent HIV, you should first  get tested for HIV. You should then be tested every 3 months for as long as you are taking the medicine. Pregnancy  If you are about to stop having your period (premenopausal) and you may become pregnant, seek counseling before you get pregnant.  Take 400 to 800 micrograms (mcg) of folic acid every day if you become pregnant.  Ask for birth control (contraception) if you want to prevent pregnancy. Osteoporosis and menopause Osteoporosis is a disease in which the bones lose minerals and strength with aging. This can result in bone fractures. If you are 73 years old or older, or if you are at risk for osteoporosis and fractures, ask your health care provider if you should:  Be screened for bone loss.  Take a calcium or vitamin D supplement to lower your risk of fractures.  Be given hormone replacement therapy (HRT) to treat symptoms of menopause. Follow these instructions at home: Lifestyle  Do not use any products that contain nicotine or tobacco, such as cigarettes, e-cigarettes, and chewing tobacco. If you need help quitting, ask your health care provider.  Do not use street drugs.  Do not share needles.  Ask your health care provider for help if you need support or information about quitting drugs. Alcohol use  Do not drink alcohol if: ? Your health care provider tells you not to drink. ? You are pregnant, may be pregnant, or are planning to become pregnant.  If you drink alcohol: ? Limit how much you use to 0-1 drink a day. ? Limit intake if you are breastfeeding.  Be aware of how much alcohol is in your drink. In the [place] one drink equals one 12 oz bottle of beer (355 mL), one 5 oz glass of wine (148 mL), or one 1 oz glass of hard liquor (44 mL). General instructions  Schedule regular health, dental, and eye exams.  Stay current with your vaccines.  Tell your health care provider if: ? You often feel depressed. ? You have ever been abused or do not feel safe at  home. Summary  Adopting a healthy lifestyle and getting preventive care are important in promoting health and wellness.  Follow your health care provider's instructions about healthy diet, exercising, and getting tested or screened for diseases.  Follow your health care provider's instructions on monitoring your cholesterol and blood pressure. This information is not intended to replace advice given to you by your health care provider. Make sure you discuss any questions you have with your health care provider. Document Revised: 09/17/2018 Document Reviewed: 09/17/2018 Elsevier Patient Education  2020 [employer].

## 2020-01-15 NOTE — Telephone Encounter (Signed)
I called pt and left a vm to call ofc to sch CT

## 2020-01-18 LAB — CA 125: CA 125: 23 U/mL (ref <35)

## 2020-01-19 ENCOUNTER — Ambulatory Visit
Admission: RE | Admit: 2020-01-19 | Discharge: 2020-01-19 | Disposition: A | Discharge status: Home / Self Care | Payer: 59 | Source: Ambulatory Visit | Attending: Family | Admitting: Family

## 2020-01-19 ENCOUNTER — Other Ambulatory Visit: Payer: Self-pay

## 2020-01-19 DIAGNOSIS — R109 Unspecified abdominal pain: Secondary | ICD-10-CM | POA: Insufficient documentation

## 2020-01-19 DIAGNOSIS — R140 Abdominal distension (gaseous): Secondary | ICD-10-CM | POA: Insufficient documentation

## 2020-01-19 DIAGNOSIS — Z0000 Encounter for general adult medical examination without abnormal findings: Secondary | ICD-10-CM | POA: Insufficient documentation

## 2020-01-19 MED ORDER — IOHEXOL 300 MG/ML  SOLN
100.0000 mL | Freq: Once | INTRAMUSCULAR | Status: AC | PRN
  Administered 2020-01-19: 100 mL via INTRAVENOUS

## 2020-01-20 ENCOUNTER — Telehealth: Payer: Self-pay | Admitting: *Deleted

## 2020-01-20 DIAGNOSIS — Z87891 Personal history of nicotine dependence: Secondary | ICD-10-CM

## 2020-01-20 NOTE — Telephone Encounter (Signed)
Received referral for low dose lung cancer screening CT scan. Message left at phone number listed in EMR for patient to call me back to facilitate scheduling scan.  

## 2020-01-21 NOTE — Telephone Encounter (Signed)
Received referral for low dose lung cancer screening CT scan. Message left at phone number listed in EMR for patient to call me back to facilitate scheduling scan.  

## 2020-01-21 NOTE — Addendum Note (Signed)
Addended by: Lieutenant Diego on: 01/21/2020 03:43 PM   Modules accepted: Orders

## 2020-01-21 NOTE — Telephone Encounter (Signed)
Received referral for initial lung cancer screening scan. Contacted patient and obtained smoking history,(current, 41 pack year) as well as answering questions related to screening process. Patient denies signs of lung cancer such as weight loss or hemoptysis. Patient denies comorbidity that would prevent curative treatment if lung cancer were found. Patient is scheduled for shared decision making visit and CT scan on 02/02/20 at 130pm.

## 2020-01-22 ENCOUNTER — Encounter: Payer: Self-pay | Admitting: Family

## 2020-01-22 ENCOUNTER — Other Ambulatory Visit: Payer: Self-pay | Admitting: Family

## 2020-01-22 DIAGNOSIS — N281 Cyst of kidney, acquired: Secondary | ICD-10-CM

## 2020-02-02 ENCOUNTER — Other Ambulatory Visit: Payer: Self-pay

## 2020-02-02 ENCOUNTER — Encounter: Payer: Self-pay | Admitting: Urology

## 2020-02-02 ENCOUNTER — Ambulatory Visit
Admission: RE | Admit: 2020-02-02 | Discharge: 2020-02-02 | Disposition: A | Discharge status: Home / Self Care | Payer: 59 | Source: Ambulatory Visit | Attending: Oncology | Admitting: Oncology

## 2020-02-02 ENCOUNTER — Inpatient Hospital Stay: Payer: 59 | Admitting: Hospice and Palliative Medicine

## 2020-02-02 ENCOUNTER — Ambulatory Visit: Payer: 59 | Admitting: Urology

## 2020-02-02 ENCOUNTER — Encounter: Payer: Self-pay | Admitting: Oncology

## 2020-02-02 VITALS — BP 112/83 | HR 66

## 2020-02-02 DIAGNOSIS — F17210 Nicotine dependence, cigarettes, uncomplicated: Secondary | ICD-10-CM | POA: Diagnosis not present

## 2020-02-02 DIAGNOSIS — N281 Cyst of kidney, acquired: Secondary | ICD-10-CM | POA: Diagnosis not present

## 2020-02-02 DIAGNOSIS — Z87891 Personal history of nicotine dependence: Secondary | ICD-10-CM

## 2020-02-02 DIAGNOSIS — N393 Stress incontinence (female) (male): Secondary | ICD-10-CM | POA: Diagnosis not present

## 2020-02-02 NOTE — Patient Instructions (Signed)

## 2020-02-02 NOTE — Progress Notes (Signed)
In accordance with CMS guidelines, patient has met eligibility criteria including age, absence of signs or symptoms of lung cancer.  Social History   Tobacco Use  . Smoking status: Current Every Day Smoker    Packs/day: 1.00    Years: 44.00    Pack years: 44.00    Types: Cigarettes  . Smokeless tobacco: Never Used  Substance Use Topics  . Alcohol use: No  . Drug use: No      A shared decision-making session was conducted prior to the performance of CT scan. This includes one or more decision aids, includes benefits and harms of screening, follow-up diagnostic testing, over-diagnosis, false positive rate, and total radiation exposure.   Counseling on the importance of adherence to annual lung cancer LDCT screening, impact of co-morbidities, and ability or willingness to undergo diagnosis and treatment is imperative for compliance of the program.   Counseling on the importance of continued smoking cessation for former smokers; the importance of smoking cessation for current smokers, and information about tobacco cessation interventions have been given to patient including [place] [facility] and [facility] quit [place] programs.   Written order for lung cancer screening with LDCT has been given to the patient and any and all questions have been answered to the best of my abilities.    Yearly follow up will be coordinated by Shawn Perkins, Thoracic Navigator.  Time Total: 15 minutes  Visit consisted of counseling and education dealing with complex health screening. Greater than 50%  of this time was spent counseling and coordinating care related to the above assessment and plan.  Signed by: Kymberli Wiegand, PhD, NP-C [phone] (Work Cell) 

## 2020-02-02 NOTE — Progress Notes (Signed)
02/02/20 10:36 AM   Meryl Crutch Loraine Stroud [birth year] [id number]  CC: Renal cyst, stress incontinence  HPI: I saw Ms. Eimer in urology clinic today for evaluation of a simple right renal cyst as well as stress incontinence.  She is a 54 year old relatively healthy female with a distant history of hysterectomy who recently underwent a CT of the abdomen and pelvis with contrast for vague abdominal pain and bloating.  Aside from some mild constipation and mild diverticulosis this was relatively benign.  There was an incidentally seen 2 cm right simple renal cyst.  She denies any flank pain or gross hematuria.  She is a 45-pack-year smoking history and is working on quitting.  She denies any significant urinary complaints aside from some mild stress incontinence.  This is improved with Kegel exercises.  She denies any significant urgency or urge incontinence.  No recent urinalysis to review.  Renal function is normal with creatinine 0.76, EGFR greater than 60.   PMH: Past Medical History:  Diagnosis Date  . Arthritis    lower back  . Endometriosis   . GERD (gastroesophageal reflux disease)   . PONV (postoperative nausea and vomiting)   . Renal cyst     Surgical History: Past Surgical History:  Procedure Laterality Date  . ABDOMINAL HYSTERECTOMY     for endometriosis; Dr. Vernie Ammons, one ovary in place ( unsure which side), NO cervix per patient. No h/o abnormal pap smear  . BREAST SURGERY     Dr. Koleen Nimrod, normal  . NASAL SEPTOPLASTY W/ TURBINOPLASTY Bilateral 07/23/2018   Procedure: NASAL SEPTOPLASTY WITH TURBINATE REDUCTION;  Surgeon: Carloyn Manner, MD;  Location: [facility];  Service: ENT;  Laterality: Bilateral;  . SPINAL FUSION    . SPINE SURGERY  2011   L4-5, S1 fusion, Dr. Mauri Pole  . VAGINAL DELIVERY     3   Family History: Family History  Problem Relation Age of Onset  . Heart disease Mother   . Diabetes Mother   . Heart disease Maternal Grandmother    . Heart disease Maternal Grandfather        CABG  . Multiple sclerosis Daughter   . Cancer Maternal Aunt        brain  . Breast cancer Neg Hx     Social History:  reports that she has been smoking cigarettes. She has a 44.00 pack-year smoking history. She has never used smokeless tobacco. She reports that she does not drink alcohol or use drugs.  Physical Exam: BP 112/83 (BP Location: Left Arm, Patient Position: Sitting, Cuff Size: Large)   Pulse 66    Constitutional:  Alert and oriented, No acute distress. Cardiovascular: No clubbing, cyanosis, or edema. Respiratory: Normal respiratory effort, no increased work of breathing. GI: Abdomen is soft, nontender, nondistended, no abdominal masses GU: No CVA tenderness  Laboratory Data: Reviewed, see HPI  Pertinent Imaging: I have personally reviewed the CT abdomen/pelvis with contrast.  There is a 2 cm right lower pole renal cyst that is simple, Bosniak 1.  No nephrolithiasis, hydronephrosis, or enhancing renal lesions.  Assessment & Plan:   In summary, she is a 54 year old female with an incidentally found 2 cm simple right renal cyst.  I personally reviewed the images with the patient in clinic today.  We discussed that this is a benign finding and does not require further surveillance, imaging, or work-up.  We also discussed her stress incontinence, and I discussed options ranging from pelvic floor physical therapy and Kegel exercises, pessary  with GYN, to more invasive procedures like a urethral sling.  She currently is minimally bothered by her urinary symptoms, and they are well controlled with Kegel exercises.  Follow-up as needed No further imaging/surveillance/work-up needed for simple right renal cyst  Nickolas Madrid, MD 02/02/2020  [facility] [address] [place], [place] [id number] [phone]

## 2020-02-03 ENCOUNTER — Encounter: Payer: Self-pay | Admitting: Family

## 2020-02-03 ENCOUNTER — Telehealth: Payer: Self-pay | Admitting: Family

## 2020-02-03 DIAGNOSIS — I700 Atherosclerosis of aorta: Secondary | ICD-10-CM | POA: Insufficient documentation

## 2020-02-03 NOTE — Telephone Encounter (Signed)
Call pt Atherosclerosis was seen on patient CT lung cancer screening.  When this occurs, I do recommend patient to consider low-dose cholesterol medication.  Please see if she is willing to consider this.  We can Also discussed over an appointment.  Let me know

## 2020-02-03 NOTE — Telephone Encounter (Signed)
I spoke with patient & she wants to discuss in person before committing to cholesterol medication. Patient comes in Friday 4/30.

## 2020-02-05 ENCOUNTER — Ambulatory Visit: Payer: 59 | Admitting: Family

## 2020-02-05 ENCOUNTER — Encounter: Payer: Self-pay | Admitting: Family

## 2020-02-05 ENCOUNTER — Encounter: Payer: Self-pay | Admitting: *Deleted

## 2020-02-05 ENCOUNTER — Other Ambulatory Visit: Payer: Self-pay

## 2020-02-05 DIAGNOSIS — I700 Atherosclerosis of aorta: Secondary | ICD-10-CM | POA: Diagnosis not present

## 2020-02-05 DIAGNOSIS — N281 Cyst of kidney, acquired: Secondary | ICD-10-CM | POA: Diagnosis not present

## 2020-02-05 DIAGNOSIS — R140 Abdominal distension (gaseous): Secondary | ICD-10-CM | POA: Diagnosis not present

## 2020-02-05 DIAGNOSIS — K760 Fatty (change of) liver, not elsewhere classified: Secondary | ICD-10-CM | POA: Diagnosis not present

## 2020-02-05 NOTE — Patient Instructions (Addendum)
Let me know at any point if you would consider cholesterol medication as discussed Please read information below  Nonalcoholic Fatty Liver Disease Diet Introduction Nonalcoholic fatty liver disease is a condition that causes fat to accumulate in and around the liver. The disease makes it harder for the liver to work the way that it should. Following a healthy diet can help to keep nonalcoholic fatty liver disease under control. It can also help to prevent or improve conditions that are associated with the disease, such as heart disease, diabetes, high blood pressure, and abnormal cholesterol levels. Along with regular exercise, this diet:  Promotes weight loss.  Helps to control blood sugar levels.  Helps to improve the way that the body uses insulin. What do I need to know about this diet?  Use the glycemic index (GI) to plan your meals. The index tells you how quickly a food will raise your blood sugar. Choose low-GI foods. These foods take a longer time to raise blood sugar.  Keep track of how many calories you take in. Eating the right amount of calories will help you to achieve a healthy weight.  You may want to follow a Mediterranean diet. This diet includes a lot of vegetables, lean meats or fish, whole grains, fruits, and healthy oils and fats. What foods can I eat? Grains  Whole grains, such as whole-wheat or whole-grain breads, crackers, tortillas, cereals, and pasta. Stone-ground whole wheat. Pumpernickel bread. Unsweetened oatmeal. Bulgur. Barley. Quinoa. Brown or wild rice. Corn or whole-wheat flour tortillas. Vegetables  Lettuce. Spinach. Peas. Beets. Cauliflower. Cabbage. Broccoli. Carrots. Tomatoes. Squash. Eggplant. Herbs. Peppers. Onions. Cucumbers. Brussels sprouts. Yams and sweet potatoes. Beans. Lentils. Fruits  Bananas. Apples. Oranges. Grapes. Papaya. Mango. Pomegranate. Kiwi. Grapefruit. Cherries. Meats and Other Protein Sources  Seafood and shellfish. Lean meats.  Poultry. Tofu. Dairy  Low-fat or fat-free dairy products, such as yogurt, cottage cheese, and cheese. Beverages  Water. Sugar-free drinks. Tea. Coffee. Low-fat or skim milk. Milk alternatives, such as soy or almond milk. Real fruit juice. Condiments  Mustard. Relish. Low-fat, low-sugar ketchup and barbecue sauce. Low-fat or fat-free mayonnaise. Sweets and Desserts  Sugar-free sweets. Fats and Oils  Avocado. Canola or olive oil. Nuts and nut butters. Seeds. The items listed above may not be a complete list of recommended foods or beverages. Contact your dietitian for more options.  What foods are not recommended? Palm oil and coconut oil. Processed foods. Fried foods. Sweetened drinks, such as sweet tea, milkshakes, snow cones, iced sweet drinks, and sodas. Alcohol. Sweets. Foods that contain a lot of salt or sodium. The items listed above may not be a complete list of foods and beverages to avoid. Contact your dietitian for more information.  This information is not intended to replace advice given to you by your health care provider. Make sure you discuss any questions you have with your health care provider. Document Released: 02/08/2015 Document Revised: 03/01/2016 Document Reviewed: 10/19/2014  2017 Elsevier  Fatty Liver Introduction  Fatty liver, also called hepatic steatosis or steatohepatitis, is a condition in which too much fat has built up in your liver cells. The liver removes harmful substances from your bloodstream. It produces fluids your body needs. It also helps your body use and store energy from the food you eat. In many cases, fatty liver does not cause symptoms or problems. It is often diagnosed when tests are being done for other reasons. However, over time, fatty liver can cause inflammation that may lead to more  serious liver problems, such as scarring of the liver (cirrhosis). What are the causes? Causes of fatty liver may include:  Drinking too much  alcohol.  Poor nutrition.  Obesity.  Cushing syndrome.  Diabetes.  Hyperlipidemia.  Pregnancy.  Certain drugs.  Poisons.  Some viral infections. What increases the risk? You may be more likely to develop fatty liver if you:  Abuse alcohol.  Are pregnant.  Are overweight.  Have diabetes.  Have hepatitis.  Have a high triglyceride level. What are the signs or symptoms? Fatty liver often does not cause any symptoms. In cases where symptoms develop, they can include:  Fatigue.  Weakness.  Weight loss.  Confusion.  Abdominal pain.  Yellowing of your skin and the white parts of your eyes (jaundice).  Nausea and vomiting. How is this diagnosed? Fatty liver may be diagnosed by:  Physical exam and medical history.  Blood tests.  Imaging tests, such as an ultrasound, CT scan, or MRI.  Liver biopsy. A small sample of liver tissue is removed using a needle. The sample is then looked at under a microscope. How is this treated? Fatty liver is often caused by other health conditions. Treatment for fatty liver may involve medicines and lifestyle changes to manage conditions such as:  Alcoholism.  High cholesterol.  Diabetes.  Being overweight or obese. Follow these instructions at home:  Eat a healthy diet as directed by your health care provider.  Exercise regularly. This can help you lose weight and control your cholesterol and diabetes. Talk to your health care provider about an exercise plan and which activities are best for you.  Do not drink alcohol.  Take medicines only as directed by your health care provider. Contact a health care provider if: You have difficulty controlling your:  Blood sugar.  Cholesterol.  Alcohol consumption. Get help right away if:  You have abdominal pain.  You have jaundice.  You have nausea and vomiting. This information is not intended to replace advice given to you by your health care provider. Make sure  you discuss any questions you have with your health care provider.   Constipation plan  1) Take Miralax once to twice per day until you have a bowel movement. You will end up titrating the use of Miralax. For example, you  may find that using the medication every other day or three times a week is a good bowel regimen for you. Or perhaps, twice weekly.   2)  If you do not get results with Miralax alone, you may then add Bisacodyl suppository daily to regimen until you get desired bowel results.  3) Take Colace ( stool softener) twice daily every day.   It is MOST important to drink LOTS of water and follow a HIGH fiber diet to keep foods moving through the gut. You may add Metamucil to a beverage that you drink.  Information on prevention of constipation as well as acute treatment for constipation as included below.  If there is no improvement in your symptoms, or if there is any worsening of symptoms, or if you have any additional concerns, please return to this clinic for re-evaluation; or, if we are closed, consider going to the Emergency Room for evaluation.    Constipation Prevention What is Constipation? Constipation is hard, dry bowel movements or the inability to have a bowel movement.  You can also feel like you need to have a bowel movement but not be able to.  It can also be painful when  you strain to have a bowel movement.  Taking narcotic pain medicine after surgery can make you constipated, even if you have never had a problem with constipation. What Do I Need To Do? The best thing to do for constipation is to keep it from happening.  This can be done by: Adding laxatives to your daily routine, when taking prescription pain medicines after surgery. Add 17 gm Miralax daily or 100 mg Colace once or twice daily. (Miralax is mixed in water. Colace is a pill). They soften your bowel movements to make them easier to pass and hurt less. Drink plenty of water to help flush your bowels.   (Eight, 8 ounce glasses daily) Eat foods high in fiber such as whole grains, vegetables, cereals, fruits, and prune juice (5-7 servings a day or 25 grams).  If you do not know how much fiber a food has in it you can look on the label under "dietary fiber."  If you have trouble getting enough fiber in your diet you may want to consider a fiber supplement such as Metamucil or Citrucel.  Also, be aware that eating fiber without drinking enough water can make constipation worse. If you do become constipated some medications that may help are: Bisacodyl (Dulcolax) is available in tablet form or a suppository. Glycerin suppositories are also a good choice if you need a fast acting medication. Everybody is different and may have different results.  Talk to your pharmacist or health care provider about your specific problems. They can help you choose the best product for you.  Why Is It Important for Me To Do This? Being constipated is not something you have to live with.  There are many things you can do to help.   Feeling bad can interfere with your recovery after surgery.  If constipation goes on for too long it can become a very serious medical problem. You may need to visit your doctor or go to the hospital.  That is why it is very important to drink lots of water, eat enough fiber, and keep it from happening.  Ask Questions We want to answer all of your questions and concerns.  That's why we encourage you to use a program called Ask Me 3T, created by the [employer].  By using Ask Me 3T you are encouraged to ask 3 simple (yet, potentially life saving questions) whenever you are talking with your physician, nurse or pharmacist: What is my main problem? What do I need to do? Why is it important for me to do this? By understanding the answer to these three questions and any other questions you may have, you have the knowledge necessary to manage your health. Please feel very  comfortable asking any questions. Healthcare is complicated, so if you hear an answer you do not understand, please ask your health care team to explain again.   Sources: [employer] 07-27-10 N

## 2020-02-05 NOTE — Assessment & Plan Note (Signed)
Normal liver enzymes.  We discussed today lifestyle measures, primarily weight loss.  Congratulated her on weight loss. I have given her diet information as well. Will follow.

## 2020-02-05 NOTE — Assessment & Plan Note (Signed)
Discussed risk of stroke, MI with patient today.  Advised after seeing atherosclerosis on both CT chest and CT Ab, I would like to start a statin.  She will consider this and let me know

## 2020-02-05 NOTE — Assessment & Plan Note (Signed)
Benign, consulted with urology, Dr. Diamantina Providence.  No further surveillance

## 2020-02-05 NOTE — Progress Notes (Signed)
Subjective:    Patient ID: Heather Pope, female    DOB: 30-Mar-1966, 54 y.o.   MRN: [id number]  CC: Heather Pope is a 54 y.o. female who presents today for follow up.   HPI: Feels well today Here to discuss CT chest, CT abdomen  No new complaints Abdominal bloating has improved on pepcid and after she stopped drinking caffeinated soda   No CP.   Has changed diet and intentionally lost 4 lbs in 3 weeks.   Has had constipation 'all my life' .  May go a week between bowel movements.  Coffee helps have a BM. No straining, blood in stool. Colonoscopy scheduled for June.   No leg pain, numbness.   Atherosclerosis seen on CT chest  CT chest lung cancer screening showed atherosclerosis as well.   CT abdomen and pelvis shows 7 mm anterolisthesis  multilevel facet hypertrophy in the lumbar spine, aortic atherosclerosis.  No suspicious adnexal mass.  Moderate volume of stool.  2 cm simple cyst medial mid lower right kidney.  Minor focal fatty infiltration HISTORY:  Past Medical History:  Diagnosis Date  . Arthritis    lower back  . Endometriosis   . GERD (gastroesophageal reflux disease)   . PONV (postoperative nausea and vomiting)   . Renal cyst    Past Surgical History:  Procedure Laterality Date  . ABDOMINAL HYSTERECTOMY     for endometriosis; Dr. Vernie Ammons, one ovary in place ( unsure which side), NO cervix per patient. No h/o abnormal pap smear  . BREAST SURGERY     Dr. Koleen Nimrod, normal  . NASAL SEPTOPLASTY W/ TURBINOPLASTY Bilateral 07/23/2018   Procedure: NASAL SEPTOPLASTY WITH TURBINATE REDUCTION;  Surgeon: Carloyn Manner, MD;  Location: [facility];  Service: ENT;  Laterality: Bilateral;  . SPINAL FUSION    . SPINE SURGERY  2011   L4-5, S1 fusion, Dr. Mauri Pole  . VAGINAL DELIVERY     3   Family History  Problem Relation Age of Onset  . Heart disease Mother   . Diabetes Mother   . Heart disease Maternal Grandmother   . Heart disease  Maternal Grandfather        CABG  . Multiple sclerosis Daughter   . Cancer Maternal Aunt        brain  . Breast cancer Neg Hx     Allergies: Ibuprofen, Levaquin [levofloxacin in d5w], Oxycodone, and Penicillins Current Outpatient Medications on File Prior to Visit  Medication Sig Dispense Refill  . acetaminophen (TYLENOL) 500 MG tablet Take 500 mg by mouth every 6 (six) hours as needed. 2 tablets three times daily     . ALPRAZolam (XANAX) 0.25 MG tablet TAKE 1 TABLET (0.25 MG TOTAL) BY MOUTH DAILY AS NEEDED FOR ANXIETY. 30 tablet 0  . Cholecalciferol (VITAMIN D3) 2000 units TABS Take by mouth every other day.    . cyclobenzaprine (FLEXERIL) 5 MG tablet TAKE 1 TABLET BY MOUTH TWICE A DAY 60 tablet 2  . famotidine (PEPCID AC) 10 MG tablet Take 1 tablet (10 mg total) by mouth 2 (two) times daily. 60 tablet 2  . Multiple Vitamin (MULTIVITAMIN) tablet Take 1 tablet by mouth daily.     No current facility-administered medications on file prior to visit.    Social History   Tobacco Use  . Smoking status: Current Every Day Smoker    Packs/day: 1.00    Years: 44.00    Pack years: 44.00    Types: Cigarettes  . Smokeless  tobacco: Never Used  Substance Use Topics  . Alcohol use: No  . Drug use: No    Review of Systems  Constitutional: Negative for chills and fever.  Respiratory: Negative for cough.   Cardiovascular: Negative for chest pain and palpitations.  Gastrointestinal: Positive for constipation (chronic). Negative for abdominal distention, abdominal pain, nausea and vomiting.      Objective:    BP 120/72   Pulse 69   Temp (!) 97.3 F (36.3 C) (Temporal)   Ht 5\' 8"  (1.727 m)   Wt 208 lb 3.2 oz (94.4 kg)   SpO2 98%   BMI 31.66 kg/m  BP Readings from Last 3 Encounters:  02/05/20 120/72  02/02/20 112/83  01/15/20 110/70   Wt Readings from Last 3 Encounters:  02/05/20 208 lb 3.2 oz (94.4 kg)  02/02/20 212 lb (96.2 kg)  01/15/20 212 lb 9.6 oz (96.4 kg)    Physical  Exam Vitals reviewed.  Constitutional:      Appearance: She is well-developed.  Eyes:     Conjunctiva/sclera: Conjunctivae normal.  Cardiovascular:     Rate and Rhythm: Normal rate and regular rhythm.     Pulses: Normal pulses.     Heart sounds: Normal heart sounds.  Pulmonary:     Effort: Pulmonary effort is normal.     Breath sounds: Normal breath sounds. No wheezing, rhonchi or rales.  Skin:    General: Skin is warm and dry.  Neurological:     Mental Status: She is alert.  Psychiatric:        Speech: Speech normal.        Behavior: Behavior normal.        Thought Content: Thought content normal.        Assessment & Plan:   Problem List Items Addressed This Visit      Cardiovascular and Mediastinum   Atherosclerosis of aorta (HCC)    Discussed risk of stroke, MI with patient today.  Advised after seeing atherosclerosis on both CT chest and CT Ab, I would like to start a statin.  She will consider this and let me know        Digestive   Fatty liver disease, nonalcoholic    Normal liver enzymes.  We discussed today lifestyle measures, primarily weight loss.  Congratulated her on weight loss. I have given her diet information as well. Will follow.         Genitourinary   Renal cyst, right    Benign, consulted with urology, Dr. Diamantina Providence.  No further surveillance        Other   Abdominal bloating    Improved.  Suspect related to chronic constipation, GERD.  Patient feels better on Pepcid AC.  She has upcoming colonoscopy.  Will follow          I am having Curstin L. Marriott maintain her acetaminophen, multivitamin, Vitamin D3, cyclobenzaprine, ALPRAZolam, and famotidine.   No orders of the defined types were placed in this encounter.   Return precautions given.   Risks, benefits, and alternatives of the medications and treatment plan prescribed today were discussed, and patient expressed understanding.   Education regarding symptom management and  diagnosis given to patient on AVS.  Continue to follow with Burnard Hawthorne, FNP for routine health maintenance.   Heather Pope and I agreed with plan.   Mable Paris, FNP

## 2020-02-05 NOTE — Assessment & Plan Note (Signed)
Improved.  Suspect related to chronic constipation, GERD.  Patient feels better on Pepcid AC.  She has upcoming colonoscopy.  Will follow

## 2020-02-12 ENCOUNTER — Telehealth (INDEPENDENT_AMBULATORY_CARE_PROVIDER_SITE_OTHER): Payer: Self-pay | Admitting: Gastroenterology

## 2020-02-12 DIAGNOSIS — Z1211 Encounter for screening for malignant neoplasm of colon: Secondary | ICD-10-CM

## 2020-02-12 NOTE — Progress Notes (Signed)
Gastroenterology Pre-Procedure Review  Request Date: Friday 03/25/20 Requesting Physician: Dr. Allen Norris  PATIENT REVIEW QUESTIONS: The patient responded to the following health history questions as indicated:    1. Are you having any GI issues? no 2. Do you have a personal history of Polyps? no 3. Do you have a family history of Colon Cancer or Polyps? no 4. Diabetes Mellitus? no 5. Joint replacements in the past 12 months?no 6. Major health problems in the past 3 months?no 7. Any artificial heart valves, MVP, or defibrillator?no    MEDICATIONS & ALLERGIES:    Patient reports the following regarding taking any anticoagulation/antiplatelet therapy:   Plavix, Coumadin, Eliquis, Xarelto, Lovenox, Pradaxa, Brilinta, or Effient? no Aspirin? no  Patient confirms/reports the following medications:  Current Outpatient Medications  Medication Sig Dispense Refill  . acetaminophen (TYLENOL) 500 MG tablet Take 500 mg by mouth every 6 (six) hours as needed. 2 tablets three times daily     . ALPRAZolam (XANAX) 0.25 MG tablet TAKE 1 TABLET (0.25 MG TOTAL) BY MOUTH DAILY AS NEEDED FOR ANXIETY. 30 tablet 0  . Cholecalciferol (VITAMIN D3) 2000 units TABS Take by mouth every other day.    . cyclobenzaprine (FLEXERIL) 5 MG tablet TAKE 1 TABLET BY MOUTH TWICE A DAY 60 tablet 2  . famotidine (PEPCID AC) 10 MG tablet Take 1 tablet (10 mg total) by mouth 2 (two) times daily. 60 tablet 2  . Multiple Vitamin (MULTIVITAMIN) tablet Take 1 tablet by mouth daily.     No current facility-administered medications for this visit.    Patient confirms/reports the following allergies:  Allergies  Allergen Reactions  . Ibuprofen   . Levaquin [Levofloxacin In D5w]     Knee problems  . Oxycodone     All narcotics makes blood pressure drop low  . Penicillins     Orders Placed This Encounter  Procedures  . Procedural/ Surgical Case Request: COLONOSCOPY WITH PROPOFOL    Standing Status:   Standing    Number of  Occurrences:   1    Order Specific Question:   Pre-op diagnosis    Answer:   screening colonoscopy    Order Specific Question:   CPT Code    Answer:   [id number]    AUTHORIZATION INFORMATION Primary Insurance: 1D#: Group #:  Secondary Insurance: 1D#: Group #:  SCHEDULE INFORMATION: Date: Friday 03/25/20 Time: Location:[facility]

## 2020-03-14 ENCOUNTER — Encounter: Payer: Self-pay | Admitting: Gastroenterology

## 2020-03-15 ENCOUNTER — Other Ambulatory Visit: Payer: Self-pay

## 2020-03-15 DIAGNOSIS — Z1211 Encounter for screening for malignant neoplasm of colon: Secondary | ICD-10-CM

## 2020-03-15 MED ORDER — NA SULFATE-K SULFATE-MG SULF 17.5-3.13-1.6 GM/177ML PO SOLN: 354.0000 mL | Freq: Once | ORAL | 0 refills | Status: AC

## 2020-03-15 NOTE — Progress Notes (Signed)
Patient states she never received instructions or the prep. Sent prep to pharmacy

## 2020-03-16 ENCOUNTER — Ambulatory Visit: Payer: 59 | Admitting: Family

## 2020-03-23 ENCOUNTER — Other Ambulatory Visit
Admission: RE | Admit: 2020-03-23 | Discharge: 2020-03-23 | Disposition: A | Discharge status: Home / Self Care | Payer: 59 | Source: Ambulatory Visit | Attending: Gastroenterology | Admitting: Gastroenterology

## 2020-03-23 ENCOUNTER — Other Ambulatory Visit: Payer: Self-pay

## 2020-03-23 DIAGNOSIS — Z01812 Encounter for preprocedural laboratory examination: Secondary | ICD-10-CM | POA: Insufficient documentation

## 2020-03-23 DIAGNOSIS — Z20822 Contact with and (suspected) exposure to covid-19: Secondary | ICD-10-CM | POA: Insufficient documentation

## 2020-03-24 LAB — SARS CORONAVIRUS 2 (TAT 6-24 HRS): SARS Coronavirus 2: NEGATIVE

## 2020-03-24 NOTE — Discharge Instructions (Signed)
General Anesthesia, Adult, Care After This sheet gives you information about how to care for yourself after your procedure. Your health care provider may also give you more specific instructions. If you have problems or questions, contact your health care provider. What can I expect after the procedure? After the procedure, the following side effects are common:  Pain or discomfort at the IV site.  Nausea.  Vomiting.  Sore throat.  Trouble concentrating.  Feeling cold or chills.  Weak or tired.  Sleepiness and fatigue.  Soreness and body aches. These side effects can affect parts of the body that were not involved in surgery. Follow these instructions at home:  For at least 24 hours after the procedure:  Have a responsible adult stay with you. It is important to have someone help care for you until you are awake and alert.  Rest as needed.  Do not: ? Participate in activities in which you could fall or become injured. ? Drive. ? Use heavy machinery. ? Drink alcohol. ? Take sleeping pills or medicines that cause drowsiness. ? Make important decisions or sign legal documents. ? Take care of children on your own. Eating and drinking  Follow any instructions from your health care provider about eating or drinking restrictions.  When you feel hungry, start by eating small amounts of foods that are soft and easy to digest (bland), such as toast. Gradually return to your regular diet.  Drink enough fluid to keep your urine pale yellow.  If you vomit, rehydrate by drinking water, juice, or clear broth. General instructions  If you have sleep apnea, surgery and certain medicines can increase your risk for breathing problems. Follow instructions from your health care provider about wearing your sleep device: ? Anytime you are sleeping, including during daytime naps. ? While taking prescription pain medicines, sleeping medicines, or medicines that make you drowsy.  Return to  your normal activities as told by your health care provider. Ask your health care provider what activities are safe for you.  Take over-the-counter and prescription medicines only as told by your health care provider.  If you smoke, do not smoke without supervision.  Keep all follow-up visits as told by your health care provider. This is important. Contact a health care provider if:  You have nausea or vomiting that does not get better with medicine.  You cannot eat or drink without vomiting.  You have pain that does not get better with medicine.  You are unable to pass urine.  You develop a skin rash.  You have a fever.  You have redness around your IV site that gets worse. Get help right away if:  You have difficulty breathing.  You have chest pain.  You have blood in your urine or stool, or you vomit blood. Summary  After the procedure, it is common to have a sore throat or nausea. It is also common to feel tired.  Have a responsible adult stay with you for the first 24 hours after general anesthesia. It is important to have someone help care for you until you are awake and alert.  When you feel hungry, start by eating small amounts of foods that are soft and easy to digest (bland), such as toast. Gradually return to your regular diet.  Drink enough fluid to keep your urine pale yellow.  Return to your normal activities as told by your health care provider. Ask your health care provider what activities are safe for you. This information is not   intended to replace advice given to you by your health care provider. Make sure you discuss any questions you have with your health care provider. Document Revised: 09/27/2017 Document Reviewed: 05/10/2017 Elsevier Patient Education  2020 [employer]  

## 2020-03-25 ENCOUNTER — Ambulatory Visit: Payer: 59 | Admitting: Anesthesiology

## 2020-03-25 ENCOUNTER — Ambulatory Visit
Admission: RE | Admit: 2020-03-25 | Discharge: 2020-03-25 | Disposition: A | Discharge status: Home / Self Care | Payer: 59 | Attending: Gastroenterology | Admitting: Gastroenterology

## 2020-03-25 ENCOUNTER — Other Ambulatory Visit: Payer: Self-pay

## 2020-03-25 ENCOUNTER — Encounter: Payer: Self-pay | Admitting: Gastroenterology

## 2020-03-25 ENCOUNTER — Encounter
Admission: RE | Disposition: A | Discharge status: Home / Self Care | Payer: Self-pay | Source: Home / Self Care | Attending: Gastroenterology

## 2020-03-25 DIAGNOSIS — F17210 Nicotine dependence, cigarettes, uncomplicated: Secondary | ICD-10-CM | POA: Diagnosis not present

## 2020-03-25 DIAGNOSIS — Z1211 Encounter for screening for malignant neoplasm of colon: Secondary | ICD-10-CM | POA: Insufficient documentation

## 2020-03-25 DIAGNOSIS — K635 Polyp of colon: Secondary | ICD-10-CM

## 2020-03-25 DIAGNOSIS — K219 Gastro-esophageal reflux disease without esophagitis: Secondary | ICD-10-CM | POA: Diagnosis not present

## 2020-03-25 DIAGNOSIS — K640 First degree hemorrhoids: Secondary | ICD-10-CM | POA: Diagnosis not present

## 2020-03-25 DIAGNOSIS — Z885 Allergy status to narcotic agent status: Secondary | ICD-10-CM | POA: Insufficient documentation

## 2020-03-25 DIAGNOSIS — Z79899 Other long term (current) drug therapy: Secondary | ICD-10-CM | POA: Insufficient documentation

## 2020-03-25 DIAGNOSIS — Z881 Allergy status to other antibiotic agents status: Secondary | ICD-10-CM | POA: Insufficient documentation

## 2020-03-25 DIAGNOSIS — I739 Peripheral vascular disease, unspecified: Secondary | ICD-10-CM | POA: Insufficient documentation

## 2020-03-25 DIAGNOSIS — Z886 Allergy status to analgesic agent status: Secondary | ICD-10-CM | POA: Diagnosis not present

## 2020-03-25 DIAGNOSIS — Z880 Allergy status to penicillin: Secondary | ICD-10-CM | POA: Insufficient documentation

## 2020-03-25 HISTORY — PX: POLYPECTOMY: SHX5525

## 2020-03-25 HISTORY — PX: COLONOSCOPY WITH PROPOFOL: SHX5780

## 2020-03-25 SURGERY — COLONOSCOPY WITH PROPOFOL
Anesthesia: General | Site: Rectum

## 2020-03-25 MED ORDER — STERILE WATER FOR IRRIGATION IR SOLN
Status: DC | PRN
  Administered 2020-03-25: .05 mL

## 2020-03-25 MED ORDER — LACTATED RINGERS IV SOLN: INTRAVENOUS | Status: DC

## 2020-03-25 MED ORDER — PROPOFOL 10 MG/ML IV BOLUS
INTRAVENOUS | Status: DC | PRN
  Administered 2020-03-25: 50 mg via INTRAVENOUS
  Administered 2020-03-25: 40 mg via INTRAVENOUS
  Administered 2020-03-25: 30 mg via INTRAVENOUS
  Administered 2020-03-25: 50 mg via INTRAVENOUS
  Administered 2020-03-25: 40 mg via INTRAVENOUS
  Administered 2020-03-25: 30 mg via INTRAVENOUS
  Administered 2020-03-25: 20 mg via INTRAVENOUS
  Administered 2020-03-25: 30 mg via INTRAVENOUS

## 2020-03-25 MED ORDER — SODIUM CHLORIDE 0.9 % IV SOLN: INTRAVENOUS | Status: DC

## 2020-03-25 SURGICAL SUPPLY — 24 items
CLIP HMST 235XBRD CATH ROT (MISCELLANEOUS) IMPLANT
CLIP RESOLUTION 360 11X235 (MISCELLANEOUS)
ELECT REM PT RETURN 9FT ADLT (ELECTROSURGICAL)
ELECTRODE REM PT RTRN 9FT ADLT (ELECTROSURGICAL) IMPLANT
FCP ESCP3.2XJMB 240X2.8X (MISCELLANEOUS)
FORCEPS BIOP RAD 4 LRG CAP 4 (CUTTING FORCEPS) IMPLANT
FORCEPS BIOP RJ4 240 W/NDL (MISCELLANEOUS)
FORCEPS ESCP3.2XJMB 240X2.8X (MISCELLANEOUS) IMPLANT
GOWN CVR UNV OPN BCK APRN NK (MISCELLANEOUS) ×4 IMPLANT
GOWN ISOL THUMB LOOP REG UNIV (MISCELLANEOUS) ×2
INJECTOR VARIJECT VIN23 (MISCELLANEOUS) IMPLANT
KIT DEFENDO VALVE AND CONN (KITS) IMPLANT
KIT ENDO PROCEDURE OLY (KITS) ×3 IMPLANT
MANIFOLD NEPTUNE II (INSTRUMENTS) ×3 IMPLANT
MARKER SPOT ENDO TATTOO 5ML (MISCELLANEOUS) IMPLANT
PROBE APC STR FIRE (PROBE) IMPLANT
RETRIEVER NET ROTH 2.5X230 LF (MISCELLANEOUS) IMPLANT
SNARE SHORT THROW 13M SML OVAL (MISCELLANEOUS) ×3 IMPLANT
SNARE SHORT THROW 30M LRG OVAL (MISCELLANEOUS) IMPLANT
SNARE SNG USE RND 15MM (INSTRUMENTS) IMPLANT
SPOT EX ENDOSCOPIC TATTOO (MISCELLANEOUS)
TRAP ETRAP POLY (MISCELLANEOUS) ×3 IMPLANT
VARIJECT INJECTOR VIN23 (MISCELLANEOUS)
WATER STERILE IRR 250ML POUR (IV SOLUTION) ×3 IMPLANT

## 2020-03-25 NOTE — Anesthesia Preprocedure Evaluation (Addendum)
Anesthesia Evaluation  Patient identified by MRN, date of birth, ID band Patient awake    History of Anesthesia Complications (+) PONV and history of anesthetic complications  Airway Mallampati: I  TM Distance: >3 FB Neck ROM: Full    Dental no notable dental hx.    Pulmonary Current Smoker (1/2 ppd) and Patient abstained from smoking.,    Pulmonary exam normal        Cardiovascular Exercise Tolerance: Good + Peripheral Vascular Disease (atherosclerotic aorta seen on CT )  Normal cardiovascular exam     Neuro/Psych negative neurological ROS     GI/Hepatic negative GI ROS, Fatty liver   Endo/Other  negative endocrine ROS  Renal/GU Renal cyst, benign     Musculoskeletal negative musculoskeletal ROS (+)   Abdominal   Peds  Hematology negative hematology ROS (+)   Anesthesia Other Findings   Reproductive/Obstetrics                            Anesthesia Physical Anesthesia Plan  ASA: II  Anesthesia Plan: General   Post-op Pain Management:    Induction: Intravenous  PONV Risk Score and Plan: 3 and TIVA, Propofol infusion and Treatment may vary due to age or medical condition  Airway Management Planned: Nasal Cannula and Natural Airway  Additional Equipment: None  Intra-op Plan:   Post-operative Plan:   Informed Consent: I have reviewed the patients History and Physical, chart, labs and discussed the procedure including the risks, benefits and alternatives for the proposed anesthesia with the patient or authorized representative who has indicated his/her understanding and acceptance.       Plan Discussed with: CRNA  Anesthesia Plan Comments:         Anesthesia Quick Evaluation

## 2020-03-25 NOTE — Anesthesia Procedure Notes (Signed)
Date/Time: 03/25/2020 9:30 AM Performed by: Cameron Ali, CRNA Pre-anesthesia Checklist: Patient identified, Emergency Drugs available, Suction available, Timeout performed and Patient being monitored Patient Re-evaluated:Patient Re-evaluated prior to induction Oxygen Delivery Method: Nasal cannula Ventilation: Mask ventilation throughout procedure Placement Confirmation: positive ETCO2

## 2020-03-25 NOTE — H&P (Signed)
Lucilla Lame, MD [address], [place] [address] Phone: [phone] Fax : [phone]  Primary Care Physician:  Burnard Hawthorne, FNP Primary Gastroenterologist:  Dr. Allen Norris  Pre-Procedure History & Physical: HPI:  Heather Pope is a 54 y.o. female is here for a screening colonoscopy.   Past Medical History:  Diagnosis Date  . Arthritis    lower back  . Endometriosis   . GERD (gastroesophageal reflux disease)   . PONV (postoperative nausea and vomiting)   . Renal cyst     Past Surgical History:  Procedure Laterality Date  . ABDOMINAL HYSTERECTOMY     for endometriosis; Dr. Vernie Ammons, one ovary in place ( unsure which side), NO cervix per patient. No h/o abnormal pap smear  . BREAST SURGERY     Dr. Koleen Nimrod, normal  . NASAL SEPTOPLASTY W/ TURBINOPLASTY Bilateral 07/23/2018   Procedure: NASAL SEPTOPLASTY WITH TURBINATE REDUCTION;  Surgeon: Carloyn Manner, MD;  Location: [facility];  Service: ENT;  Laterality: Bilateral;  . SPINAL FUSION    . SPINE SURGERY  2011   L4-5, S1 fusion, Dr. Mauri Pole  . VAGINAL DELIVERY     3    Prior to Admission medications   Medication Sig Start Date End Date Taking? Authorizing Provider  acetaminophen (TYLENOL) 500 MG tablet Take 500 mg by mouth every 6 (six) hours as needed. 2 tablets three times daily    Yes [provider]  ALPRAZolam (XANAX) 0.25 MG tablet TAKE 1 TABLET (0.25 MG TOTAL) BY MOUTH DAILY AS NEEDED FOR ANXIETY. 12/07/19  Yes Burnard Hawthorne, FNP  cyclobenzaprine (FLEXERIL) 5 MG tablet TAKE 1 TABLET BY MOUTH TWICE A DAY 12/16/18  Yes Burnard Hawthorne, FNP  famotidine (PEPCID AC) 10 MG tablet Take 1 tablet (10 mg total) by mouth 2 (two) times daily. 01/15/20  Yes Burnard Hawthorne, FNP  Cholecalciferol (VITAMIN D3) 2000 units TABS Take by mouth every other day.    [provider]  Multiple Vitamin (MULTIVITAMIN) tablet Take 1 tablet by mouth daily.    [provider]    Allergies as of 02/12/2020 - Review Complete 02/12/2020  Allergen Reaction Noted  . Ibuprofen  02/05/2017  . Levaquin [levofloxacin in d5w]  07/05/2014  . Oxycodone  07/26/2011  . Penicillins  07/26/2011    Family History  Problem Relation Age of Onset  . Heart disease Mother   . Diabetes Mother   . Heart disease Maternal Grandmother   . Heart disease Maternal Grandfather        CABG  . Multiple sclerosis Daughter   . Cancer Maternal Aunt        brain  . Breast cancer Neg Hx     Social History   Socioeconomic History  . Marital status: Single    Spouse name: Not on file  . Number of children: Not on file  . Years of education: Not on file  . Highest education level: Not on file  Occupational History  . Not on file  Tobacco Use  . Smoking status: Current Every Day Smoker    Packs/day: 0.25    Years: 44.00    Pack years: 11.00    Types: Cigarettes  . Smokeless tobacco: Never Used  Vaping Use  . Vaping Use: Never used  Substance and Sexual Activity  . Alcohol use: No  . Drug use: No  . Sexual activity: Yes    Birth control/protection: Post-menopausal  Other Topics Concern  .  Not on file  Social History Narrative   Lives in [place].    CNA at [employer]   Boyfriend at home.    Diet- back on low carb diet   Exercise- cannot exercise as much as would like due to LBP   Social Determinants of Health   Financial Resource Strain:   . Difficulty of Paying Living Expenses:   Food Insecurity:   . Worried About Charity fundraiser in the Last Year:   . Arboriculturist in the Last Year:   Transportation Needs:   . Film/video editor (Medical):   Marland Kitchen Lack of Transportation (Non-Medical):   Physical Activity:   . Days of Exercise per Week:   . Minutes of Exercise per Session:   Stress:   . Feeling of Stress :   Social Connections:   . Frequency of Communication with Friends and Family:   . Frequency of Social Gatherings with Friends  and Family:   . Attends Religious Services:   . Active Member of Clubs or Organizations:   . Attends Archivist Meetings:   Marland Kitchen Marital Status:   Intimate Partner Violence:   . Fear of Current or Ex-Partner:   . Emotionally Abused:   Marland Kitchen Physically Abused:   . Sexually Abused:     Review of Systems: See HPI, otherwise negative ROS  Physical Exam: BP 118/81   Pulse 70   Temp 97.7 F (36.5 C) (Temporal)   Resp 16   Ht 5\' 8"  (1.727 m)   Wt 89.4 kg   SpO2 99%   BMI 29.95 kg/m  General:   Alert,  pleasant and cooperative in NAD Head:  Normocephalic and atraumatic. Neck:  Supple; no masses or thyromegaly. Lungs:  Clear throughout to auscultation.    Heart:  Regular rate and rhythm. Abdomen:  Soft, nontender and nondistended. Normal bowel sounds, without guarding, and without rebound.   Neurologic:  Alert and  oriented x4;  grossly normal neurologically.  Impression/Plan: Heather Pope is now here to undergo a screening colonoscopy.  Risks, benefits, and alternatives regarding colonoscopy have been reviewed with the patient.  Questions have been answered.  All parties agreeable.

## 2020-03-25 NOTE — Transfer of Care (Signed)
Immediate Anesthesia Transfer of Care Note  Patient: Heather Pope  Procedure(s) Performed: COLONOSCOPY WITH PROPOFOL (N/A Rectum) POLYPECTOMY (Rectum)  Patient Location: PACU  Anesthesia Type: General  Level of Consciousness: awake, alert  and patient cooperative  Airway and Oxygen Therapy: Patient Spontanous Breathing and Patient connected to supplemental oxygen  Post-op Assessment: Post-op Vital signs reviewed, Patient's Cardiovascular Status Stable, Respiratory Function Stable, Patent Airway and No signs of Nausea or vomiting  Post-op Vital Signs: Reviewed and stable  Complications: No complications documented.

## 2020-03-25 NOTE — Anesthesia Postprocedure Evaluation (Signed)
Anesthesia Post Note  Patient: Heather Pope  Procedure(s) Performed: COLONOSCOPY WITH PROPOFOL (N/A Rectum) POLYPECTOMY (Rectum)     Patient location during evaluation: PACU Anesthesia Type: General Level of consciousness: awake and alert Pain management: pain level controlled Vital Signs Assessment: post-procedure vital signs reviewed and stable Respiratory status: spontaneous breathing, nonlabored ventilation, respiratory function stable and patient connected to nasal cannula oxygen Cardiovascular status: blood pressure returned to baseline and stable Postop Assessment: no apparent nausea or vomiting Anesthetic complications: no   No complications documented.  Adele Barthel Alicia Ackert

## 2020-03-25 NOTE — Op Note (Signed)
[facility] Patient Name: Heather Pope Procedure Date: 03/25/2020 9:21 AM MRN: [id number] Account #: [id number] Date of Birth: 06-10-1966 Admit Type: Outpatient Age: 54 Room: [facility] OR ROOM 01 Gender: Female Note Status: Finalized Procedure:             Colonoscopy Indications:           Screening for colorectal malignant neoplasm Providers:             Lucilla Lame MD, MD Referring MD:          Yvetta Coder. Arnett (Referring MD) Medicines:             Propofol per Anesthesia Complications:         No immediate complications. Procedure:             Pre-Anesthesia Assessment:                        - Prior to the procedure, a History and Physical was                         performed, and patient medications and allergies were                         reviewed. The patient's tolerance of previous                         anesthesia was also reviewed. The risks and benefits                         of the procedure and the sedation options and risks                         were discussed with the patient. All questions were                         answered, and informed consent was obtained. Prior                         Anticoagulants: The patient has taken no previous                         anticoagulant or antiplatelet agents. ASA Grade                         Assessment: II - A patient with mild systemic disease.                         After reviewing the risks and benefits, the patient                         was deemed in satisfactory condition to undergo the                         procedure.                        After obtaining informed consent, the colonoscope was  passed under direct vision. Throughout the procedure,                         the patient's blood pressure, pulse, and oxygen                         saturations were monitored continuously. The was                         introduced through the anus and  advanced to the the                         cecum, identified by appendiceal orifice and ileocecal                         valve. The colonoscopy was performed without                         difficulty. The patient tolerated the procedure well.                         The quality of the bowel preparation was excellent. Findings:      The perianal and digital rectal examinations were normal.      Two sessile polyps were found in the sigmoid colon. The polyps were 3 to       4 mm in size. These polyps were removed with a cold snare. Resection and       retrieval were complete.      Non-bleeding internal hemorrhoids were found during retroflexion. The       hemorrhoids were Grade I (internal hemorrhoids that do not prolapse). Impression:            - Two 3 to 4 mm polyps in the sigmoid colon, removed                         with a cold snare. Resected and retrieved.                        - Non-bleeding internal hemorrhoids. Recommendation:        - Discharge patient to home.                        - Resume previous diet.                        - Continue present medications.                        - Await pathology results.                        - Repeat colonoscopy in 5 years if polyp adenoma and                         10 years if hyperplastic Procedure Code(s):     --- Professional ---                        [phone], Colonoscopy, flexible; with removal of  tumor(s), polyp(s), or other lesion(s) by snare                         technique Diagnosis Code(s):     --- Professional ---                        Z12.11, Encounter for screening for malignant neoplasm                         of colon                        K63.5, Polyp of colon CPT copyright 2019 American Medical Association. All rights reserved. The codes documented in this report are preliminary and upon coder review may  be revised to meet current compliance requirements. Lucilla Lame MD, MD 03/25/2020  9:55:42 AM This report has been signed electronically. Number of Addenda: 0 Note Initiated On: 03/25/2020 9:21 AM Scope Withdrawal Time: 0 hours 10 minutes 13 seconds  Total Procedure Duration: 0 hours 18 minutes 42 seconds  Estimated Blood Loss:  Estimated blood loss: none.      [facility]

## 2020-03-28 ENCOUNTER — Encounter: Payer: Self-pay | Admitting: Gastroenterology

## 2020-03-28 LAB — SURGICAL PATHOLOGY

## 2020-03-31 ENCOUNTER — Encounter: Payer: Self-pay | Admitting: Gastroenterology

## 2020-04-13 ENCOUNTER — Other Ambulatory Visit: Payer: Self-pay | Admitting: Family

## 2020-06-15 DIAGNOSIS — H52223 Regular astigmatism, bilateral: Secondary | ICD-10-CM | POA: Diagnosis not present

## 2020-06-15 DIAGNOSIS — H5213 Myopia, bilateral: Secondary | ICD-10-CM | POA: Diagnosis not present

## 2020-09-05 ENCOUNTER — Other Ambulatory Visit: Payer: Self-pay | Admitting: Family

## 2020-09-05 DIAGNOSIS — F4323 Adjustment disorder with mixed anxiety and depressed mood: Secondary | ICD-10-CM

## 2020-09-05 NOTE — Telephone Encounter (Signed)
Call pt   I have refilled your xanax  However I wanted to remind you that this is controlled substance.   In order for me to prescribe medication based on your usage,  patient must be seen every 6 months.   Please make follow-up appointment this month for any further refills.    I looked up patient on [place] Controlled Substances Reporting System and saw no activity that raised concern of inappropriate use.

## 2020-09-06 NOTE — Telephone Encounter (Signed)
Pt active on mychart & I have sent message to for to call to schedule f/u.

## 2020-09-09 ENCOUNTER — Other Ambulatory Visit: Payer: Self-pay | Admitting: Family

## 2020-09-09 DIAGNOSIS — Z1231 Encounter for screening mammogram for malignant neoplasm of breast: Secondary | ICD-10-CM

## 2020-09-22 ENCOUNTER — Other Ambulatory Visit: Payer: Self-pay

## 2020-09-22 ENCOUNTER — Ambulatory Visit
Admission: RE | Admit: 2020-09-22 | Discharge: 2020-09-22 | Disposition: A | Discharge status: Home / Self Care | Payer: 59 | Source: Ambulatory Visit | Attending: Family | Admitting: Family

## 2020-09-22 DIAGNOSIS — Z1231 Encounter for screening mammogram for malignant neoplasm of breast: Secondary | ICD-10-CM | POA: Diagnosis not present

## 2020-09-23 ENCOUNTER — Ambulatory Visit: Payer: 59

## 2021-01-06 ENCOUNTER — Telehealth: Payer: Self-pay | Admitting: Licensed Clinical Social Worker

## 2021-01-06 NOTE — Telephone Encounter (Signed)
Patient declined scheduling appt because she was charged 200 dollars last year and she didn't want to do it again. She is also a Furniture conservator/restorer. I will see if the nurse could call her and clarify. She would schedule if it is free.

## 2021-02-22 ENCOUNTER — Telehealth: Payer: Self-pay

## 2021-02-22 NOTE — Telephone Encounter (Signed)
Left message for patient to call back and schedule lung screening CT scan. Advised her that it should be no OOP cost to her and that if she gets a bill, to let Shawn know.

## 2021-03-01 ENCOUNTER — Encounter: Payer: 59 | Admitting: Family

## 2021-03-21 ENCOUNTER — Other Ambulatory Visit: Payer: Self-pay

## 2021-03-21 ENCOUNTER — Telehealth: Payer: Self-pay | Admitting: Family

## 2021-03-21 ENCOUNTER — Encounter: Payer: Self-pay | Admitting: Family Medicine

## 2021-03-21 ENCOUNTER — Telehealth (INDEPENDENT_AMBULATORY_CARE_PROVIDER_SITE_OTHER): Payer: 59 | Admitting: Family Medicine

## 2021-03-21 DIAGNOSIS — U071 COVID-19: Secondary | ICD-10-CM | POA: Diagnosis not present

## 2021-03-21 MED ORDER — DOXYCYCLINE HYCLATE 100 MG PO TABS
100.0000 mg | ORAL_TABLET | Freq: Two times a day (BID) | ORAL | 0 refills | Status: DC
  Filled 2021-03-21: qty 14, 7d supply, fill #0

## 2021-03-21 MED ORDER — PREDNISONE 10 MG PO TABS
ORAL_TABLET | ORAL | 0 refills | Status: AC
  Filled 2021-03-21: qty 15, 5d supply, fill #0

## 2021-03-21 NOTE — Assessment & Plan Note (Signed)
The patient is 9 days out from onset of symptoms.  Health at work released her to go back to work.  She has continued cough productive of yellow mucus which could indicate secondary bacterial infection versus exacerbation of underlying emphysema related to her smoking.  We will start her on doxycycline and prednisone.  She requests that the prednisone be a taper.  She was advised to seek medical attention in the emergency department for shortness of breath or cough productive of blood.  If she develops fevers or any worsening symptoms she will let us know right away.

## 2021-03-21 NOTE — Progress Notes (Signed)
Virtual Visit via telephone Note  This visit type was conducted due to national recommendations for restrictions regarding the COVID-19 pandemic (e.g. social distancing).  This format is felt to be most appropriate for this patient at this time.  All issues noted in this document were discussed and addressed.  No physical exam was performed (except for noted visual exam findings with Video Visits).   I connected with Heather Pope today at 10:30 AM EDT by telephone and verified that I am speaking with the correct person using two identifiers. Location patient: work Location provider: work  Persons participating in the virtual visit: patient, provider  I discussed the limitations, risks, security and privacy concerns of performing an evaluation and management service by telephone and the availability of in person appointments. I also discussed with the patient that there may be a patient responsible charge related to this service. The patient expressed understanding and agreed to proceed.  Interactive audio and video telecommunications were attempted between this provider and patient, however failed, due to patient having technical difficulties OR patient did not have access to video capability.  We continued and completed visit with audio only.   Reason for visit: same day visit  HPI: [id number]: Patient notes onset of symptoms on 03/12/2021.  She tested positive the same day.  She just did not feel well.  She ended up with low-grade fevers and chills as well as cough last week.  She has mild postnasal drip.  She has returned to work at this time as employee health released her to return.  She notes continued cough and yesterday started coughing up yellow mucus.  She has been using Alka-Seltzer cold and flu with some benefit though stopped this several days ago.  No shortness of breath.  She does note some mild wheezing.  She did lose her taste and smell last week.  She was exposed to somebody at  her facility that had COVID though that was a little more than 10 days before her onset of symptoms.  She is vaccinated and has received 1 booster.  She does smoke and appears to have emphysema based on prior CT imaging.   ROS: See pertinent positives and negatives per HPI.  Past Medical History:  Diagnosis Date   Arthritis    lower back   Endometriosis    GERD (gastroesophageal reflux disease)    PONV (postoperative nausea and vomiting)    Renal cyst     Past Surgical History:  Procedure Laterality Date   ABDOMINAL HYSTERECTOMY     for endometriosis; Dr. Vernie Ammons, one ovary in place ( unsure which side), NO cervix per patient. No h/o abnormal pap smear   BREAST SURGERY     Dr. Koleen Nimrod, normal   COLONOSCOPY WITH PROPOFOL N/A 03/25/2020   Procedure: COLONOSCOPY WITH PROPOFOL;  Surgeon: Lucilla Lame, MD;  Location: [facility];  Service: Endoscopy;  Laterality: N/A;  priority 4   NASAL SEPTOPLASTY W/ TURBINOPLASTY Bilateral 07/23/2018   Procedure: NASAL SEPTOPLASTY WITH TURBINATE REDUCTION;  Surgeon: Carloyn Manner, MD;  Location: [facility];  Service: ENT;  Laterality: Bilateral;   POLYPECTOMY  03/25/2020   Procedure: POLYPECTOMY;  Surgeon: Lucilla Lame, MD;  Location: [facility];  Service: Endoscopy;;   SPINAL FUSION     SPINE SURGERY  2011   L4-5, S1 fusion, Dr. Mauri Pole   VAGINAL DELIVERY     3    Family History  Problem Relation Age of Onset   Heart disease Mother  Diabetes Mother    Heart disease Maternal Grandmother    Heart disease Maternal Grandfather        CABG   Multiple sclerosis Daughter    Cancer Maternal Aunt        brain   Breast cancer Neg Hx     SOCIAL HX: Smoker   Current Outpatient Medications:    acetaminophen (TYLENOL) 500 MG tablet, Take 500 mg by mouth every 6 (six) hours as needed. 2 tablets three times daily , Disp: , Rfl:    ALPRAZolam (XANAX) 0.5 MG tablet, Take 0.5 mg by mouth., Disp: , Rfl:     cyclobenzaprine (FLEXERIL) 5 MG tablet, TAKE 1 TABLET BY MOUTH TWICE A DAY, Disp: 60 tablet, Rfl: 1   doxycycline (VIBRA-TABS) 100 MG tablet, Take 1 tablet (100 mg total) by mouth 2 (two) times daily., Disp: 14 tablet, Rfl: 0   famotidine (PEPCID AC) 10 MG tablet, Take 1 tablet (10 mg total) by mouth 2 (two) times daily., Disp: 60 tablet, Rfl: 2   predniSONE (DELTASONE) 10 MG tablet, Take 5 tablets (50 mg total) by mouth daily with breakfast for 1 day, THEN 4 tablets (40 mg total) daily with breakfast for 1 day, THEN 3 tablets (30 mg total) daily with breakfast for 1 day, THEN 2 tablets (20 mg total) daily with breakfast for 1 day, THEN 1 tablet (10 mg total) daily with breakfast for 1 day., Disp: 15 tablet, Rfl: 0   Cholecalciferol (VITAMIN D3) 2000 units TABS, Take by mouth every other day. (Patient not taking: Reported on 03/21/2021), Disp: , Rfl:   EXAM:  This was a telephone visit and thus no physical exam was completed.  ASSESSMENT AND PLAN:  Discussed the following assessment and plan:  Problem List Items Addressed This Visit     COVID-19    The patient is 9 days out from onset of symptoms.  Health at work released her to go back to work.  She has continued cough productive of yellow mucus which could indicate secondary bacterial infection versus exacerbation of underlying emphysema related to her smoking.  We will start her on doxycycline and prednisone.  She requests that the prednisone be a taper.  She was advised to seek medical attention in the emergency department for shortness of breath or cough productive of blood.  If she develops fevers or any worsening symptoms she will let us know right away.        Return if symptoms worsen or fail to improve.   I discussed the assessment and treatment plan with the patient. The patient was provided an opportunity to ask questions and all were answered. The patient agreed with the plan and demonstrated an understanding of the  instructions.   The patient was advised to call back or seek an in-person evaluation if the symptoms worsen or if the condition fails to improve as anticipated.  I provided 14 minutes of non-face-to-face time during this encounter.   Tommi Rumps, MD

## 2021-03-21 NOTE — Telephone Encounter (Signed)
Patient called and made a telephone visit appointment for today with Dr Caryl Bis. She tested positive for covid on 03/12/2021. She is coughing up yellow and has a slight wheeze. She is unable to do a virtual, it must be a telephone visit per patient.

## 2021-03-21 NOTE — Telephone Encounter (Signed)
Patient had a phone visit today.  Devonne Lalani,cma

## 2021-03-24 ENCOUNTER — Telehealth: Payer: Self-pay

## 2021-03-24 NOTE — Telephone Encounter (Signed)
LMTCB to get patient scheduled for f/u with Joycelyn Schmid.

## 2021-03-24 NOTE — Telephone Encounter (Signed)
-----   Message from Burnard Hawthorne, [place] sent at 03/21/2021  1:43 PM EDT ----- Please ensure f/u scheduled   ----- Message ----- From: Leone Haven, MD Sent: 03/21/2021  10:46 AM EDT To: Burnard Hawthorne, FNP  FYI.  It looks like she is due for follow-up with you as well.

## 2021-03-30 DIAGNOSIS — L570 Actinic keratosis: Secondary | ICD-10-CM | POA: Diagnosis not present

## 2021-03-30 DIAGNOSIS — L578 Other skin changes due to chronic exposure to nonionizing radiation: Secondary | ICD-10-CM | POA: Diagnosis not present

## 2021-03-30 DIAGNOSIS — D1801 Hemangioma of skin and subcutaneous tissue: Secondary | ICD-10-CM | POA: Diagnosis not present

## 2021-04-26 ENCOUNTER — Encounter: Payer: Self-pay | Admitting: Family

## 2021-04-26 ENCOUNTER — Other Ambulatory Visit: Payer: Self-pay

## 2021-04-26 ENCOUNTER — Ambulatory Visit: Payer: BC Managed Care – PPO | Admitting: Family

## 2021-04-26 VITALS — BP 100/72 | HR 76 | Temp 98.3°F | Ht 68.0 in | Wt 198.2 lb

## 2021-04-26 DIAGNOSIS — F419 Anxiety disorder, unspecified: Secondary | ICD-10-CM | POA: Diagnosis not present

## 2021-04-26 DIAGNOSIS — J439 Emphysema, unspecified: Secondary | ICD-10-CM | POA: Insufficient documentation

## 2021-04-26 DIAGNOSIS — G8929 Other chronic pain: Secondary | ICD-10-CM | POA: Diagnosis not present

## 2021-04-26 DIAGNOSIS — M545 Low back pain, unspecified: Secondary | ICD-10-CM | POA: Diagnosis not present

## 2021-04-26 MED ORDER — BUDESONIDE-FORMOTEROL FUMARATE 80-4.5 MCG/ACT IN AERO: 2.0000 | INHALATION_SPRAY | Freq: Two times a day (BID) | RESPIRATORY_TRACT | 3 refills | Status: DC

## 2021-04-26 MED ORDER — CYCLOBENZAPRINE HCL 5 MG PO TABS: 5.0000 mg | ORAL_TABLET | Freq: Every day | ORAL | 3 refills | Status: DC | PRN

## 2021-04-26 MED ORDER — ALPRAZOLAM 0.5 MG PO TABS: 0.5000 mg | ORAL_TABLET | Freq: Every day | ORAL | 1 refills | Status: DC | PRN

## 2021-04-26 NOTE — Addendum Note (Signed)
Addended by: Burnard Hawthorne on: 04/26/2021 03:00 PM   Modules accepted: Orders

## 2021-04-26 NOTE — Progress Notes (Addendum)
Subjective:    Patient ID: Heather Pope, female    DOB: 07/14/1966, 55 y.o.   MRN: [id number]  CC: Heather Pope is a 55 y.o. female who presents today for follow up.   HPI: Continued covid symptoms including fatigue, cough since covid 03/12/21. Some improvement.   Daily wheezing and cough. Occasional clear phlegm.   No cp, leg swelling.   She was seen by Dr Heather Pope who prescribed prednisone, doxycycline at that time  GERD- compliant pepcid ac once daily. No epigastric burning, trouble or pain with swallowing, vomiting, nausea. Managed with avoiding trigger foods.   Takes prn flexeril 5mg  prn for right groin pain, low back pain. Would like a refill today. NO pain today.No trouble urinating, changes in bowels,  numbness, saddle anesthesia.   H/o lumbar fusion  She works as cma  Anxiety is well controlled and she uses Xanax very rarely.  She does ask for refill today     Overdue for CT lung cancer screening  Emphysema, atherosclerosis seen on CT chest  Current smoker  Due for pneumococcal 23  HISTORY:  Past Medical History:  Diagnosis Date   Arthritis    lower back   Endometriosis    GERD (gastroesophageal reflux disease)    PONV (postoperative nausea and vomiting)    Renal cyst    Past Surgical History:  Procedure Laterality Date   ABDOMINAL HYSTERECTOMY     for endometriosis; Dr. Vernie Pope, one ovary in place ( unsure which side), NO cervix per patient. No h/o abnormal pap smear   BREAST SURGERY     Dr. Koleen Pope, normal   COLONOSCOPY WITH PROPOFOL N/A 03/25/2020   Procedure: COLONOSCOPY WITH PROPOFOL;  Surgeon: Heather Lame, MD;  Location: [facility];  Service: Endoscopy;  Laterality: N/A;  priority 4   NASAL SEPTOPLASTY W/ TURBINOPLASTY Bilateral 07/23/2018   Procedure: NASAL SEPTOPLASTY WITH TURBINATE REDUCTION;  Surgeon: Heather Manner, MD;  Location: [facility];  Service: ENT;  Laterality: Bilateral;   POLYPECTOMY   03/25/2020   Procedure: POLYPECTOMY;  Surgeon: Heather Lame, MD;  Location: [facility];  Service: Endoscopy;;   SPINAL FUSION     SPINE SURGERY  2011   L4-5, S1 fusion, Dr. Mauri Pope   VAGINAL DELIVERY     3   Family History  Problem Relation Age of Onset   Heart disease Mother    Diabetes Mother    Heart disease Maternal Grandmother    Heart disease Maternal Grandfather        CABG   Multiple sclerosis Daughter    Cancer Maternal Aunt        brain   Breast cancer Neg Hx     Allergies: Ibuprofen, Levaquin [levofloxacin in d5w], Oxycodone, and Penicillins Current Outpatient Medications on File Prior to Visit  Medication Sig Dispense Refill   acetaminophen (TYLENOL) 500 MG tablet Take 500 mg by mouth every 6 (six) hours as needed. 2 tablets three times daily      famotidine (PEPCID AC) 10 MG tablet Take 1 tablet (10 mg total) by mouth 2 (two) times daily. 60 tablet 2   No current facility-administered medications on file prior to visit.    Social History   Tobacco Use   Smoking status: Every Day    Packs/day: 0.25    Years: 44.00    Pack years: 11.00    Types: Cigarettes   Smokeless tobacco: Never  Vaping Use   Vaping Use: Never used  Substance Use Topics  Alcohol use: No   Drug use: No    Review of Systems  Constitutional:  Negative for chills and fever.  HENT:  Negative for congestion and trouble swallowing.   Respiratory:  Positive for cough and wheezing. Negative for shortness of breath.   Cardiovascular:  Negative for chest pain and palpitations.  Gastrointestinal:  Negative for nausea and vomiting.  Musculoskeletal:  Positive for back pain.  Neurological:  Negative for numbness.     Objective:    BP 100/72 (BP Location: Left Arm, Patient Position: Sitting, Cuff Size: Large)   Pulse 76   Temp 98.3 F (36.8 C) (Oral)   Ht 5\' 8"  (1.727 m)   Wt 198 lb 3.2 oz (89.9 kg)   SpO2 98%   BMI 30.14 kg/m  BP Readings from Last 3 Encounters:  04/26/21  100/72  03/25/20 100/71  02/05/20 120/72   Wt Readings from Last 3 Encounters:  04/26/21 198 lb 3.2 oz (89.9 kg)  03/21/21 192 lb (87.1 kg)  03/25/20 197 lb (89.4 kg)    Physical Exam Vitals reviewed.  Constitutional:      Appearance: She is well-developed.  Eyes:     Conjunctiva/sclera: Conjunctivae normal.  Cardiovascular:     Rate and Rhythm: Normal rate and regular rhythm.     Pulses: Normal pulses.     Heart sounds: Normal heart sounds.  Pulmonary:     Effort: Pulmonary effort is normal.     Breath sounds: Normal breath sounds. No wheezing, rhonchi or rales.  Skin:    General: Skin is warm and dry.  Neurological:     Mental Status: She is alert.  Psychiatric:        Speech: Speech normal.        Behavior: Behavior normal.        Thought Content: Thought content normal.       Assessment & Plan:   Problem List Items Addressed This Visit       Respiratory   Emphysema lung (HCC)    Chronic, stable.  No acute respiratory distress today.  Had a long discussion with patient as it relates to her history of smoking.  Also think that COVID likely exacerbated cough, wheezing.  Start Symbicort.  She will call the cancer center to look into how she can afford, I will arrange CT lung cancer screening.       Relevant Medications   budesonide-formoterol (SYMBICORT) 80-4.5 MCG/ACT inhaler   Other Relevant Orders   CBC with Differential/Platelet   Comprehensive metabolic panel   Hemoglobin A1c   Lipid panel   VITAMIN D 25 Hydroxy (Vit-D Deficiency, Fractures)   TSH     Other   Anxiety - Primary    Chronic, stable.  Continue very rare use of Xanax 0.5 mg. I looked up patient on [place] Controlled Substances Reporting System PMP AWARE and saw no activity that raised concern of inappropriate use.         Relevant Medications   ALPRAZolam (XANAX) 0.5 MG tablet   Low back pain    Chronic unchanged.  She feels like pain is well controlled with as needed use of Flexeril.  I  have refilled this medication for her today.  Advised baseline lumbar and pelvis x-ray.  She declines at this time.  She will let me know if pain were to increase or new symptoms were to develop.       Relevant Medications   cyclobenzaprine (FLEXERIL) 5 MG tablet  I have discontinued Heather Pope's Vitamin D3 and doxycycline. I have also changed her cyclobenzaprine and ALPRAZolam. Additionally, I am having her start on budesonide-formoterol. Lastly, I am having her maintain her acetaminophen and famotidine.   Meds ordered this encounter  Medications   budesonide-formoterol (SYMBICORT) 80-4.5 MCG/ACT inhaler    Sig: Inhale 2 puffs into the lungs 2 (two) times daily.    Dispense:  1 each    Refill:  3    Order Specific Question:   Supervising Provider    Answer:   Deborra Medina L [[id number]]   cyclobenzaprine (FLEXERIL) 5 MG tablet    Sig: Take 1 tablet (5 mg total) by mouth daily as needed for muscle spasms.    Dispense:  30 tablet    Refill:  3    Order Specific Question:   Supervising Provider    Answer:   Crecencio Mc [[id number]]   ALPRAZolam (XANAX) 0.5 MG tablet    Sig: Take 1 tablet (0.5 mg total) by mouth daily as needed for anxiety.    Dispense:  30 tablet    Refill:  1    Order Specific Question:   Supervising Provider    Answer:   Crecencio Mc [[id number]]    Return precautions given.   Risks, benefits, and alternatives of the medications and treatment plan prescribed today were discussed, and patient expressed understanding.   Education regarding symptom management and diagnosis given to patient on AVS.  Continue to follow with Burnard Hawthorne, FNP for routine health maintenance.   Heather Pope and I agreed with plan.   Mable Paris, FNP

## 2021-04-26 NOTE — Assessment & Plan Note (Signed)
Chronic, stable.  Continue very rare use of Xanax 0.5 mg. I looked up patient on [place] Controlled Substances Reporting System PMP AWARE and saw no activity that raised concern of inappropriate use.

## 2021-04-26 NOTE — Assessment & Plan Note (Signed)
Chronic unchanged.  She feels like pain is well controlled with as needed use of Flexeril.  I have refilled this medication for her today.  Advised baseline lumbar and pelvis x-ray.  She declines at this time.  She will let me know if pain were to increase or new symptoms were to develop.

## 2021-04-26 NOTE — Patient Instructions (Addendum)
Call to make an appointment for Annual Lung cancer screen  With CT Chest: [phone]. Let me know if any issues in doing so.  Start inhaler, Symbicort

## 2021-04-26 NOTE — Assessment & Plan Note (Signed)
Chronic, stable.  No acute respiratory distress today.  Had a long discussion with patient as it relates to her history of smoking.  Also think that COVID likely exacerbated cough, wheezing.  Start Symbicort.  She will call the cancer center to look into how she can afford, I will arrange CT lung cancer screening.

## 2021-04-27 ENCOUNTER — Encounter: Payer: Self-pay | Admitting: Family

## 2021-04-27 NOTE — Telephone Encounter (Signed)
I called [facility] & they did get insurance to pay for generic Symbicort. I called patient to see if she was okay with the $35 copay. Pt stated that she was & that she will pick up today

## 2021-05-04 ENCOUNTER — Encounter: Payer: Self-pay | Admitting: Family

## 2021-05-17 ENCOUNTER — Encounter: Payer: Self-pay | Admitting: Family

## 2021-05-22 ENCOUNTER — Other Ambulatory Visit: Payer: Self-pay | Admitting: Family

## 2021-05-22 DIAGNOSIS — F419 Anxiety disorder, unspecified: Secondary | ICD-10-CM

## 2021-05-22 MED ORDER — ALPRAZOLAM 0.25 MG PO TABS: 0.2500 mg | ORAL_TABLET | Freq: Every day | ORAL | 1 refills | Status: DC | PRN

## 2021-05-22 NOTE — Progress Notes (Signed)
Xanax changed to 0.'25mg'$  , NO PRINT

## 2021-06-05 ENCOUNTER — Other Ambulatory Visit (INDEPENDENT_AMBULATORY_CARE_PROVIDER_SITE_OTHER): Payer: BC Managed Care – PPO

## 2021-06-05 ENCOUNTER — Other Ambulatory Visit: Payer: Self-pay | Admitting: Family

## 2021-06-05 ENCOUNTER — Other Ambulatory Visit: Payer: Self-pay

## 2021-06-05 DIAGNOSIS — J439 Emphysema, unspecified: Secondary | ICD-10-CM | POA: Diagnosis not present

## 2021-06-05 DIAGNOSIS — R7989 Other specified abnormal findings of blood chemistry: Secondary | ICD-10-CM

## 2021-06-05 DIAGNOSIS — E559 Vitamin D deficiency, unspecified: Secondary | ICD-10-CM

## 2021-06-05 LAB — LIPID PANEL
Cholesterol: 178 mg/dL (ref 0–200)
HDL: 62.1 mg/dL (ref >39.00)
LDL Cholesterol: 97 mg/dL (ref 0–99)
NonHDL: 115.95
Total CHOL/HDL Ratio: 3
Triglycerides: 95 mg/dL (ref 0.0–149.0)
VLDL: 19 mg/dL (ref 0.0–40.0)

## 2021-06-05 LAB — CBC WITH DIFFERENTIAL/PLATELET
Basophils Absolute: 0.1 10*3/uL (ref 0.0–0.1)
Basophils Relative: 1.1 % (ref 0.0–3.0)
Eosinophils Absolute: 0.1 10*3/uL (ref 0.0–0.7)
Eosinophils Relative: 2.1 % (ref 0.0–5.0)
HCT: 42.3 % (ref 36.0–46.0)
Hemoglobin: 13.9 g/dL (ref 12.0–15.0)
Lymphocytes Relative: 27.7 % (ref 12.0–46.0)
Lymphs Abs: 1.6 10*3/uL (ref 0.7–4.0)
MCHC: 32.8 g/dL (ref 30.0–36.0)
MCV: 88.5 fl (ref 78.0–100.0)
Monocytes Absolute: 0.4 10*3/uL (ref 0.1–1.0)
Monocytes Relative: 6.6 % (ref 3.0–12.0)
Neutro Abs: 3.7 10*3/uL (ref 1.4–7.7)
Neutrophils Relative %: 62.5 % (ref 43.0–77.0)
Platelets: 239 10*3/uL (ref 150.0–400.0)
RBC: 4.78 Mil/uL (ref 3.87–5.11)
RDW: 14 % (ref 11.5–15.5)
WBC: 5.9 10*3/uL (ref 4.0–10.5)

## 2021-06-05 LAB — COMPREHENSIVE METABOLIC PANEL
ALT: 10 U/L (ref 0–35)
AST: 15 U/L (ref 0–37)
Albumin: 4.3 g/dL (ref 3.5–5.2)
Alkaline Phosphatase: 55 U/L (ref 39–117)
BUN: 13 mg/dL (ref 6–23)
CO2: 25 mEq/L (ref 19–32)
Calcium: 9.5 mg/dL (ref 8.4–10.5)
Chloride: 104 mEq/L (ref 96–112)
Creatinine, Ser: 0.85 mg/dL (ref 0.40–1.20)
GFR: 76.3 mL/min (ref >60.00)
Glucose, Bld: 73 mg/dL (ref 70–99)
Potassium: 4.3 mEq/L (ref 3.5–5.1)
Sodium: 137 mEq/L (ref 135–145)
Total Bilirubin: 0.7 mg/dL (ref 0.2–1.2)
Total Protein: 6.7 g/dL (ref 6.0–8.3)

## 2021-06-05 LAB — HEMOGLOBIN A1C: Hgb A1c MFr Bld: 5.6 % (ref 4.6–6.5)

## 2021-06-05 LAB — TSH: TSH: 6.27 u[IU]/mL — ABNORMAL HIGH (ref 0.35–5.50)

## 2021-06-05 LAB — VITAMIN D 25 HYDROXY (VIT D DEFICIENCY, FRACTURES): VITD: 18.87 ng/mL — ABNORMAL LOW (ref 30.00–100.00)

## 2021-06-05 MED ORDER — CHOLECALCIFEROL 1.25 MG (50000 UT) PO TABS: ORAL_TABLET | ORAL | 0 refills | Status: DC

## 2021-06-06 ENCOUNTER — Telehealth: Payer: Self-pay | Admitting: Family

## 2021-06-06 NOTE — Telephone Encounter (Signed)
Patient is returning your call from earlier on her labs.

## 2021-06-07 ENCOUNTER — Encounter: Payer: Self-pay | Admitting: Family

## 2021-06-07 ENCOUNTER — Ambulatory Visit (INDEPENDENT_AMBULATORY_CARE_PROVIDER_SITE_OTHER): Payer: BC Managed Care – PPO | Admitting: Family

## 2021-06-07 ENCOUNTER — Other Ambulatory Visit: Payer: Self-pay

## 2021-06-07 VITALS — BP 110/72 | HR 68 | Temp 98.4°F | Ht 68.0 in | Wt 198.4 lb

## 2021-06-07 DIAGNOSIS — I700 Atherosclerosis of aorta: Secondary | ICD-10-CM

## 2021-06-07 DIAGNOSIS — Z0000 Encounter for general adult medical examination without abnormal findings: Secondary | ICD-10-CM | POA: Diagnosis not present

## 2021-06-07 MED ORDER — ROSUVASTATIN CALCIUM 5 MG PO TABS: 5.0000 mg | ORAL_TABLET | Freq: Every day | ORAL | 3 refills | Status: DC

## 2021-06-07 NOTE — Progress Notes (Signed)
Subjective:    Patient ID: Heather Pope, female    DOB: [birth year], 55 y.o.   MRN: [id number]  CC: Heather Pope is a 55 y.o. female who presents today for physical exam.    HPI: Feels well today, no complaints.   Atherosclerosis- she has not started crestor '5mg'$  . She would like to consider this.  She is worried as she has heart about the side effect of myalgia.  Colorectal Cancer Screening: UTD , Dr Allen Norris, repeat in 10 years.  Breast Cancer Screening: Mammogram UTD Cervical Cancer Screening: History of hysterectomy.  She states she has no cervix. Bone Health screening/DEXA for 65+: No increased fracture risk. Defer screening at this time.  Lung Cancer Screening: due          Tetanus - UTD        Pneumococcal - Candidate for. declines   Alcohol use:  none Smoking/tobacco use: smoker.     HISTORY:  Past Medical History:  Diagnosis Date   Arthritis    lower back   Endometriosis    GERD (gastroesophageal reflux disease)    PONV (postoperative nausea and vomiting)    Renal cyst     Past Surgical History:  Procedure Laterality Date   ABDOMINAL HYSTERECTOMY     for endometriosis; Dr. Vernie Ammons, one ovary in place ( unsure which side), NO cervix per patient. No h/o abnormal pap smear   BREAST SURGERY     Dr. Koleen Nimrod, normal   COLONOSCOPY WITH PROPOFOL N/A 03/25/2020   Procedure: COLONOSCOPY WITH PROPOFOL;  Surgeon: Lucilla Lame, MD;  Location: [facility];  Service: Endoscopy;  Laterality: N/A;  priority 4   NASAL SEPTOPLASTY W/ TURBINOPLASTY Bilateral 07/23/2018   Procedure: NASAL SEPTOPLASTY WITH TURBINATE REDUCTION;  Surgeon: Carloyn Manner, MD;  Location: [facility];  Service: ENT;  Laterality: Bilateral;   POLYPECTOMY  03/25/2020   Procedure: POLYPECTOMY;  Surgeon: Lucilla Lame, MD;  Location: [facility];  Service: Endoscopy;;   SPINAL FUSION     SPINE SURGERY  2011   L4-5, S1 fusion, Dr. Mauri Pole   VAGINAL DELIVERY      3   Family History  Problem Relation Age of Onset   Heart disease Mother    Diabetes Mother    Heart disease Maternal Grandmother    Heart disease Maternal Grandfather        CABG   Multiple sclerosis Daughter    Cancer Maternal Aunt        brain   Breast cancer Neg Hx       ALLERGIES: Ibuprofen, Levaquin [levofloxacin in d5w], Oxycodone, and Penicillins  Current Outpatient Medications on File Prior to Visit  Medication Sig Dispense Refill   acetaminophen (TYLENOL) 500 MG tablet Take 500 mg by mouth every 6 (six) hours as needed. 2 tablets three times daily      ALPRAZolam (XANAX) 0.25 MG tablet Take 1 tablet (0.25 mg total) by mouth daily as needed for anxiety. 30 tablet 1   Cholecalciferol 1.25 MG (50000 UT) TABS 50,000 units PO qwk for 8 weeks. 8 tablet 0   cyclobenzaprine (FLEXERIL) 5 MG tablet Take 1 tablet (5 mg total) by mouth daily as needed for muscle spasms. 30 tablet 3   famotidine (PEPCID AC) 10 MG tablet Take 1 tablet (10 mg total) by mouth 2 (two) times daily. 60 tablet 2   No current facility-administered medications on file prior to visit.    Social History   Tobacco Use  Smoking status: Every Day    Packs/day: 0.25    Years: 44.00    Pack years: 11.00    Types: Cigarettes   Smokeless tobacco: Never  Vaping Use   Vaping Use: Never used  Substance Use Topics   Alcohol use: No   Drug use: No    Review of Systems  Constitutional:  Negative for chills and fever.  Respiratory:  Negative for cough.   Cardiovascular:  Negative for chest pain and palpitations.  Gastrointestinal:  Negative for nausea and vomiting.     Objective:    BP 110/72 (BP Location: Left Arm, Patient Position: Sitting, Cuff Size: Large)   Pulse 68   Temp 98.4 F (36.9 C) (Oral)   Ht '5\' 8"'$  (1.727 m)   Wt 198 lb 6.4 oz (90 kg)   SpO2 98%   BMI 30.17 kg/m   BP Readings from Last 3 Encounters:  06/07/21 110/72  04/26/21 100/72  03/25/20 100/71   Wt Readings from Last 3  Encounters:  06/07/21 198 lb 6.4 oz (90 kg)  04/26/21 198 lb 3.2 oz (89.9 kg)  03/21/21 192 lb (87.1 kg)    Physical Exam Vitals reviewed.  Constitutional:      Appearance: Normal appearance. She is well-developed.  Eyes:     Conjunctiva/sclera: Conjunctivae normal.  Neck:     Thyroid: No thyroid mass or thyromegaly.  Cardiovascular:     Rate and Rhythm: Normal rate and regular rhythm.     Pulses: Normal pulses.     Heart sounds: Normal heart sounds.  Pulmonary:     Effort: Pulmonary effort is normal.     Breath sounds: Normal breath sounds. No wheezing, rhonchi or rales.  Chest:  Breasts:    Breasts are symmetrical.     Right: No inverted nipple, mass, nipple discharge, skin change or tenderness.     Left: No inverted nipple, mass, nipple discharge, skin change or tenderness.  Abdominal:     General: Bowel sounds are normal. There is no distension.     Palpations: Abdomen is soft. Abdomen is not rigid. There is no fluid wave or mass.     Tenderness: There is no abdominal tenderness. There is no guarding or rebound.  Lymphadenopathy:     Head:     Right side of head: No submental, submandibular, tonsillar, preauricular, posterior auricular or occipital adenopathy.     Left side of head: No submental, submandibular, tonsillar, preauricular, posterior auricular or occipital adenopathy.     Cervical: No cervical adenopathy.     Right cervical: No superficial, deep or posterior cervical adenopathy.    Left cervical: No superficial, deep or posterior cervical adenopathy.  Skin:    General: Skin is warm and dry.  Neurological:     Mental Status: She is alert.  Psychiatric:        Speech: Speech normal.        Behavior: Behavior normal.        Thought Content: Thought content normal.       Assessment & Plan:   Problem List Items Addressed This Visit       Cardiovascular and Mediastinum   Atherosclerosis of aorta ([place]) - Primary    Counseled on the data which supports  statin therapy in CVD risk reduction, particularly in setting of atherosclerosis.  Patient was amenable to starting low dose and we agreed Crestor 5 mg once per day and gradually increasing to daily.        Relevant Medications  rosuvastatin (CRESTOR) 5 MG tablet     Other   Routine physical examination    Clinical breast exam performed today.  Deferred pelvic exam in the absence of complaints and patient's had hysterectomy and no cervix.  CT lung cancer screening is due and patient has not scheduled due to cost at this time.  She states she will let me know when she is scheduled and plans to schedule this in October of this year.        I have discontinued Dorris L. Kirkeby's budesonide-formoterol. I am also having her start on rosuvastatin. Additionally, I am having her maintain her acetaminophen, famotidine, cyclobenzaprine, ALPRAZolam, and Cholecalciferol.   Meds ordered this encounter  Medications   rosuvastatin (CRESTOR) 5 MG tablet    Sig: Take 1 tablet (5 mg total) by mouth daily.    Dispense:  90 tablet    Refill:  3    Order Specific Question:   Supervising Provider    Answer:   Crecencio Mc [[id number]]    Return precautions given.   Risks, benefits, and alternatives of the medications and treatment plan prescribed today were discussed, and patient expressed understanding.   Education regarding symptom management and diagnosis given to patient on AVS.   Continue to follow with Burnard Hawthorne, FNP for routine health maintenance.   Heather Pope and I agreed with plan.   Mable Paris, FNP

## 2021-06-07 NOTE — Assessment & Plan Note (Signed)
Counseled on the data which supports statin therapy in CVD risk reduction, particularly in setting of atherosclerosis.  Patient was amenable to starting low dose and we agreed Crestor 5 mg once per day and gradually increasing to daily.

## 2021-06-07 NOTE — Assessment & Plan Note (Signed)
Clinical breast exam performed today.  Deferred pelvic exam in the absence of complaints and patient's had hysterectomy and no cervix.  CT lung cancer screening is due and patient has not scheduled due to cost at this time.  She states she will let me know when she is scheduled and plans to schedule this in October of this year.

## 2021-06-07 NOTE — Patient Instructions (Signed)
As discussed, I would like for you to start Crestor 5 mg.  If you  would like to start once per week and gradually work up to daily, that is very acceptable.  I ordered liver enzymes and thyroid studies to be repeated in 6 weeks time.  Please also let me know once you are able to schedule your CT lung cancer screening test  Health Maintenance for Postmenopausal Women Menopause is a normal process in which your ability to get pregnant comes to an end. This process happens slowly over many months or years, usually between the ages of 38 and 73. Menopause is complete when you have missed your menstrual periods for 12 months. It is important to talk with your health care provider about some of the most common conditions that affect women after menopause (postmenopausal women). These include heart disease, cancer, and bone loss (osteoporosis). Adopting a healthy lifestyle and getting preventive care can help to promote your health and wellness. The actions you take can also lower your chances of developing some of these common conditions. What should I know about menopause? During menopause, you may get a number of symptoms, such as: Hot flashes. These can be moderate or severe. Night sweats. Decrease in sex drive. Mood swings. Headaches. Tiredness. Irritability. Memory problems. Insomnia. Choosing to treat or not to treat these symptoms is a decision that you make with your health care provider. Do I need hormone replacement therapy? Hormone replacement therapy is effective in treating symptoms that are caused by menopause, such as hot flashes and night sweats. Hormone replacement carries certain risks, especially as you become older. If you are thinking about using estrogen or estrogen with progestin, discuss the benefits and risks with your health care provider. What is my risk for heart disease and stroke? The risk of heart disease, heart attack, and stroke increases as you age. One of the  causes may be a change in the body's hormones during menopause. This can affect how your body uses dietary fats, triglycerides, and cholesterol. Heart attack and stroke are medical emergencies. There are many things that you can do to help prevent heart disease and stroke. Watch your blood pressure High blood pressure causes heart disease and increases the risk of stroke. This is more likely to develop in people who have high blood pressure readings, are of African descent, or are overweight. Have your blood pressure checked: Every 3-5 years if you are 87-58 years of age. Every year if you are 12 years old or older. Eat a healthy diet  Eat a diet that includes plenty of vegetables, fruits, low-fat dairy products, and lean protein. Do not eat a lot of foods that are high in solid fats, added sugars, or sodium. Get regular exercise Get regular exercise. This is one of the most important things you can do for your health. Most adults should: Try to exercise for at least 150 minutes each week. The exercise should increase your heart rate and make you sweat (moderate-intensity exercise). Try to do strengthening exercises at least twice each week. Do these in addition to the moderate-intensity exercise. Spend less time sitting. Even light physical activity can be beneficial. Other tips Work with your health care provider to achieve or maintain a healthy weight. Do not use any products that contain nicotine or tobacco, such as cigarettes, e-cigarettes, and chewing tobacco. If you need help quitting, ask your health care provider. Know your numbers. Ask your health care provider to check your cholesterol and your  blood sugar (glucose). Continue to have your blood tested as directed by your health care provider. Do I need screening for cancer? Depending on your health history and family history, you may need to have cancer screening at different stages of your life. This may include screening for: Breast  cancer. Cervical cancer. Lung cancer. Colorectal cancer. What is my risk for osteoporosis? After menopause, you may be at increased risk for osteoporosis. Osteoporosis is a condition in which bone destruction happens more quickly than new bone creation. To help prevent osteoporosis or the bone fractures that can happen because of osteoporosis, you may take the following actions: If you are 70-58 years old, get at least 1,000 mg of calcium and at least 600 mg of vitamin D per day. If you are older than age 22 but younger than age 16, get at least 1,200 mg of calcium and at least 600 mg of vitamin D per day. If you are older than age 27, get at least 1,200 mg of calcium and at least 800 mg of vitamin D per day. Smoking and drinking excessive alcohol increase the risk of osteoporosis. Eat foods that are rich in calcium and vitamin D, and do weight-bearing exercises several times each week as directed by your health care provider. How does menopause affect my mental health? Depression may occur at any age, but it is more common as you become older. Common symptoms of depression include: Low or sad mood. Changes in sleep patterns. Changes in appetite or eating patterns. Feeling an overall lack of motivation or enjoyment of activities that you previously enjoyed. Frequent crying spells. Talk with your health care provider if you think that you are experiencing depression. General instructions See your health care provider for regular wellness exams and vaccines. This may include: Scheduling regular health, dental, and eye exams. Getting and maintaining your vaccines. These include: Influenza vaccine. Get this vaccine each year before the flu season begins. Pneumonia vaccine. Shingles vaccine. Tetanus, diphtheria, and pertussis (Tdap) booster vaccine. Your health care provider may also recommend other immunizations. Tell your health care provider if you have ever been abused or do not feel safe  at home. Summary Menopause is a normal process in which your ability to get pregnant comes to an end. This condition causes hot flashes, night sweats, decreased interest in sex, mood swings, headaches, or lack of sleep. Treatment for this condition may include hormone replacement therapy. Take actions to keep yourself healthy, including exercising regularly, eating a healthy diet, watching your weight, and checking your blood pressure and blood sugar levels. Get screened for cancer and depression. Make sure that you are up to date with all your vaccines. This information is not intended to replace advice given to you by your health care provider. Make sure you discuss any questions you have with your health care provider. Document Revised: 09/17/2018 Document Reviewed: 09/17/2018 Elsevier Patient Education  2022 [employer].

## 2021-06-09 NOTE — Telephone Encounter (Signed)
Pt received results in person at [facility] today 9/2

## 2021-07-18 ENCOUNTER — Other Ambulatory Visit (INDEPENDENT_AMBULATORY_CARE_PROVIDER_SITE_OTHER): Payer: BC Managed Care – PPO

## 2021-07-18 ENCOUNTER — Other Ambulatory Visit: Payer: Self-pay

## 2021-07-18 DIAGNOSIS — R7989 Other specified abnormal findings of blood chemistry: Secondary | ICD-10-CM

## 2021-07-18 LAB — T4, FREE: Free T4: 0.83 ng/dL (ref 0.60–1.60)

## 2021-07-18 LAB — TSH: TSH: 5.41 u[IU]/mL (ref 0.35–5.50)

## 2021-07-18 LAB — T3, FREE: T3, Free: 2.8 pg/mL (ref 2.3–4.2)

## 2021-11-21 ENCOUNTER — Ambulatory Visit: Payer: BC Managed Care – PPO | Admitting: Internal Medicine

## 2021-11-21 ENCOUNTER — Other Ambulatory Visit: Payer: Self-pay

## 2021-11-21 ENCOUNTER — Encounter: Payer: Self-pay | Admitting: Internal Medicine

## 2021-11-21 DIAGNOSIS — J309 Allergic rhinitis, unspecified: Secondary | ICD-10-CM | POA: Diagnosis not present

## 2021-11-21 DIAGNOSIS — H1011 Acute atopic conjunctivitis, right eye: Secondary | ICD-10-CM | POA: Insufficient documentation

## 2021-11-21 MED ORDER — OLOPATADINE HCL 0.1 % OP SOLN: 1.0000 [drp] | Freq: Two times a day (BID) | OPHTHALMIC | 12 refills | Status: DC

## 2021-11-21 NOTE — Assessment & Plan Note (Signed)
rx Patanol and zyrtec.  Call if right eye develops pain, redness or purulent drainage

## 2021-11-21 NOTE — Progress Notes (Signed)
eye

## 2021-11-21 NOTE — Patient Instructions (Signed)
Your eye looks fine.  I believe you are having allergy symptoms .    I recommend Starting antihistamine eye drops   and have sent one to your pharmacy :  1 drop twice daily  You can Add zyrtec or allegra  once daily to manage  sneezing,  runny nose, and other  nasal symptoms that occur with seasonal allergies    If you develop a  milky/yukky discharge ,  painful red eye,  call for antibiotic eye drop

## 2021-11-21 NOTE — Progress Notes (Signed)
Subjective:  Patient ID: Heather Pope, female    DOB: 08-15-1966  Age: 56 y.o. MRN: [id number]  CC: The encounter diagnosis was Allergic conjunctivitis and rhinitis, right.   This visit occurred during the SARS-CoV-2 public health emergency.  Safety protocols were in place, including screening questions prior to the visit, additional usage of staff PPE, and extensive cleaning of exam room while observing appropriate contact time as indicated for disinfecting solutions.    HPI Heather Pope presents for  Chief Complaint  Patient presents with   Eye Pain    Today pt noticed that under her eye was red and then started tingling, burning. Has now started itching and ear has started popping. No new products.     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  Started yesterday with the skin under her right eye feeling irritated and red.  Today the lower eye lid started itching.  No recent exposure to sick kids, bonfires,,  cleaning solvents.  No new facial products.  Has not used eye makeup in several weeks.  No fevers, eye pain. Some recent rhinitis,  stuffiness and right ear itching and popping.  Took alka seltzer allergy and sinus yesterday evening     Outpatient Medications Prior to Visit  Medication Sig Dispense Refill   acetaminophen (TYLENOL) 500 MG tablet Take 500 mg by mouth every 6 (six) hours as needed. 2 tablets three times daily      ALPRAZolam (XANAX) 0.25 MG tablet Take 1 tablet (0.25 mg total) by mouth daily as needed for anxiety. 30 tablet 1   cyclobenzaprine (FLEXERIL) 5 MG tablet Take 1 tablet (5 mg total) by mouth daily as needed for muscle spasms. 30 tablet 3   famotidine (PEPCID AC) 10 MG tablet Take 1 tablet (10 mg total) by mouth 2 (two) times daily. 60 tablet 2   Cholecalciferol 1.25 MG (50000 UT) TABS 50,000 units PO qwk for 8 weeks. (Patient not taking: Reported on 11/21/2021) 8 tablet 0    rosuvastatin (CRESTOR) 5 MG tablet Take 1 tablet (5 mg total) by mouth daily. (Patient not taking: Reported on 11/21/2021) 90 tablet 3   No facility-administered medications prior to visit.    Review of Systems;  Patient denies headache, fevers, malaise, unintentional weight loss, skin rash, eye pain, s, sore throat, dysphagia,  hemoptysis , cough, dyspnea, wheezing, chest pain, palpitations, orthopnea, edema, abdominal pain, nausea, melena, diarrhea, constipation, flank pain, dysuria, hematuria, urinary  Frequency, nocturia, numbness, tingling, seizures,  Focal weakness, Loss of consciousness,  Tremor, insomnia, depression, anxiety, and suicidal ideation.      Objective:  BP 120/76 (BP Location: Left Arm, Patient Position: Sitting, Cuff Size: Large)    Pulse 65    Temp 97.9 F (36.6 C) (Oral)    Ht 5\' 8"  (1.727 m)    Wt 201 lb 6.4 oz (91.4 kg)    SpO2 99%    BMI 30.62 kg/m   BP Readings from Last 3 Encounters:  11/21/21 120/76  06/07/21 110/72  04/26/21 100/72    Wt Readings from Last 3 Encounters:  11/21/21 201 lb 6.4 oz (91.4 kg)  06/07/21 198 lb 6.4 oz (90 kg)  04/26/21 198 lb 3.2 oz (89.9 kg)    General appearance: alert, cooperative and appears stated age Ears: normal TM's and external ear canals both ears Throat: lips, mucosa, and tongue normal; teeth and gums normal.  No tonsillar exudate or  erythema  Face:  salmon colored rash under both eyes.  Skin under right eye irritated .  Conjunctiva white,  no injection ,  no discharge.  Neck: no adenopathy, no carotid bruit, supple, symmetrical, trachea midline and thyroid not enlarged, symmetric, no tenderness/mass/nodules Back: symmetric, no curvature. ROM normal. No CVA tenderness. Lungs: clear to auscultation bilaterally Heart: regular rate and rhythm, S1, S2 normal, no murmur, click, rub or gallop Abdomen: soft, non-tender; bowel sounds normal; no masses,  no organomegaly Pulses: 2+ and symmetric Skin: Skin color, texture,  turgor normal. No rashes or lesions Lymph nodes: Cervical, supraclavicular, and axillary nodes normal.  Lab Results  Component Value Date   HGBA1C 5.6 06/05/2021   HGBA1C 5.5 01/15/2020   HGBA1C 5.1 07/11/2018    Lab Results  Component Value Date   CREATININE 0.85 06/05/2021   CREATININE 0.76 01/15/2020   CREATININE 0.81 08/27/2018    Lab Results  Component Value Date   WBC 5.9 06/05/2021   HGB 13.9 06/05/2021   HCT 42.3 06/05/2021   PLT 239.0 06/05/2021   GLUCOSE 73 06/05/2021   CHOL 178 06/05/2021   TRIG 95.0 06/05/2021   HDL 62.10 06/05/2021   LDLCALC 97 06/05/2021   ALT 10 06/05/2021   AST 15 06/05/2021   NA 137 06/05/2021   K 4.3 06/05/2021   CL 104 06/05/2021   CREATININE 0.85 06/05/2021   BUN 13 06/05/2021   CO2 25 06/05/2021   TSH 5.41 07/18/2021   INR 1.0 07/11/2018   HGBA1C 5.6 06/05/2021   MICROALBUR <0.7 05/11/2015    MM 3D SCREEN BREAST BILATERAL  Result Date: 09/26/2020 CLINICAL DATA:  Screening. EXAM: DIGITAL SCREENING BILATERAL MAMMOGRAM WITH TOMO AND CAD COMPARISON:  Previous exam(s). ACR Breast Density Category b: There are scattered areas of fibroglandular density. FINDINGS: There are no findings suspicious for malignancy. Images were processed with CAD. IMPRESSION: No mammographic evidence of malignancy. A result letter of this screening mammogram will be mailed directly to the patient. RECOMMENDATION: Screening mammogram in one year. (Code:SM-B-01Y) BI-RADS CATEGORY  1: Negative. Electronically Signed   By: Nolon Nations M.D.   On: 09/26/2020 14:01    Assessment & Plan:   Problem List Items Addressed This Visit     Allergic conjunctivitis and rhinitis, right    rx Patanol and zyrtec.  Call if right eye develops pain, redness or purulent drainage       I spent 20 minutes dedicated to the care of this patient on the date of this encounter to include pre-visit review of patient's medical history,  most recent imaging studies, Face-to-face  time with the patient , and post visit ordering of testing and therapeutics.    Follow-up: No follow-ups on file.   Crecencio Mc, MD

## 2021-12-05 ENCOUNTER — Ambulatory Visit: Payer: BC Managed Care – PPO | Admitting: Family

## 2022-01-02 ENCOUNTER — Other Ambulatory Visit: Payer: Self-pay | Admitting: Family

## 2022-01-02 ENCOUNTER — Encounter: Payer: Self-pay | Admitting: Family

## 2022-01-02 DIAGNOSIS — Z1231 Encounter for screening mammogram for malignant neoplasm of breast: Secondary | ICD-10-CM

## 2022-01-05 ENCOUNTER — Other Ambulatory Visit: Payer: Self-pay | Admitting: Family

## 2022-01-05 ENCOUNTER — Encounter: Payer: Self-pay | Admitting: Family

## 2022-01-05 DIAGNOSIS — F17200 Nicotine dependence, unspecified, uncomplicated: Secondary | ICD-10-CM

## 2022-02-04 ENCOUNTER — Other Ambulatory Visit: Payer: Self-pay | Admitting: Family

## 2022-02-04 DIAGNOSIS — F419 Anxiety disorder, unspecified: Secondary | ICD-10-CM

## 2022-02-14 ENCOUNTER — Ambulatory Visit
Admission: RE | Admit: 2022-02-14 | Discharge: 2022-02-14 | Disposition: A | Discharge status: Home / Self Care | Payer: BC Managed Care – PPO | Source: Ambulatory Visit | Attending: Family | Admitting: Family

## 2022-02-14 DIAGNOSIS — Z1231 Encounter for screening mammogram for malignant neoplasm of breast: Secondary | ICD-10-CM | POA: Diagnosis not present

## 2022-02-16 ENCOUNTER — Other Ambulatory Visit: Payer: Self-pay

## 2022-02-16 DIAGNOSIS — Z87891 Personal history of nicotine dependence: Secondary | ICD-10-CM

## 2022-02-16 DIAGNOSIS — F17210 Nicotine dependence, cigarettes, uncomplicated: Secondary | ICD-10-CM

## 2022-02-16 DIAGNOSIS — Z122 Encounter for screening for malignant neoplasm of respiratory organs: Secondary | ICD-10-CM

## 2022-02-27 ENCOUNTER — Ambulatory Visit
Admission: RE | Admit: 2022-02-27 | Discharge: 2022-02-27 | Disposition: A | Discharge status: Home / Self Care | Payer: BC Managed Care – PPO | Source: Ambulatory Visit | Attending: Acute Care | Admitting: Acute Care

## 2022-02-27 DIAGNOSIS — Z122 Encounter for screening for malignant neoplasm of respiratory organs: Secondary | ICD-10-CM | POA: Diagnosis not present

## 2022-02-27 DIAGNOSIS — F17210 Nicotine dependence, cigarettes, uncomplicated: Secondary | ICD-10-CM | POA: Diagnosis not present

## 2022-02-27 DIAGNOSIS — Z87891 Personal history of nicotine dependence: Secondary | ICD-10-CM | POA: Insufficient documentation

## 2022-03-01 ENCOUNTER — Other Ambulatory Visit: Payer: Self-pay

## 2022-03-01 DIAGNOSIS — Z87891 Personal history of nicotine dependence: Secondary | ICD-10-CM

## 2022-03-01 DIAGNOSIS — Z122 Encounter for screening for malignant neoplasm of respiratory organs: Secondary | ICD-10-CM

## 2022-03-01 DIAGNOSIS — F17210 Nicotine dependence, cigarettes, uncomplicated: Secondary | ICD-10-CM

## 2022-03-06 ENCOUNTER — Encounter: Payer: Self-pay | Admitting: Family

## 2022-07-03 ENCOUNTER — Ambulatory Visit (INDEPENDENT_AMBULATORY_CARE_PROVIDER_SITE_OTHER): Payer: BC Managed Care – PPO

## 2022-07-03 ENCOUNTER — Encounter: Payer: Self-pay | Admitting: Family

## 2022-07-03 ENCOUNTER — Ambulatory Visit: Payer: BC Managed Care – PPO | Admitting: Family

## 2022-07-03 VITALS — BP 138/78 | HR 70 | Temp 97.4°F | Ht 68.0 in | Wt 208.6 lb

## 2022-07-03 DIAGNOSIS — M545 Low back pain, unspecified: Secondary | ICD-10-CM

## 2022-07-03 DIAGNOSIS — I700 Atherosclerosis of aorta: Secondary | ICD-10-CM

## 2022-07-03 DIAGNOSIS — F419 Anxiety disorder, unspecified: Secondary | ICD-10-CM | POA: Diagnosis not present

## 2022-07-03 DIAGNOSIS — M5136 Other intervertebral disc degeneration, lumbar region: Secondary | ICD-10-CM | POA: Diagnosis not present

## 2022-07-03 DIAGNOSIS — M4317 Spondylolisthesis, lumbosacral region: Secondary | ICD-10-CM | POA: Diagnosis not present

## 2022-07-03 DIAGNOSIS — G8929 Other chronic pain: Secondary | ICD-10-CM

## 2022-07-03 DIAGNOSIS — M4316 Spondylolisthesis, lumbar region: Secondary | ICD-10-CM | POA: Diagnosis not present

## 2022-07-03 DIAGNOSIS — M160 Bilateral primary osteoarthritis of hip: Secondary | ICD-10-CM | POA: Diagnosis not present

## 2022-07-03 MED ORDER — CYCLOBENZAPRINE HCL 5 MG PO TABS: 5.0000 mg | ORAL_TABLET | Freq: Every day | ORAL | 2 refills | Status: DC | PRN

## 2022-07-03 MED ORDER — PREDNISONE 10 MG PO TABS: ORAL_TABLET | ORAL | 0 refills | Status: DC

## 2022-07-03 MED ORDER — SIMVASTATIN 20 MG PO TABS: 20.0000 mg | ORAL_TABLET | Freq: Every evening | ORAL | 3 refills | Status: DC

## 2022-07-03 MED ORDER — ALPRAZOLAM 0.5 MG PO TABS: ORAL_TABLET | ORAL | 0 refills | Status: DC

## 2022-07-03 NOTE — Assessment & Plan Note (Signed)
Chronic, stable.  Very infrequent use of Xanax.  I have refilled today I looked up patient on [place] Controlled Substances Reporting System PMP AWARE and saw no activity that raised concern of inappropriate use.

## 2022-07-03 NOTE — Assessment & Plan Note (Signed)
Reviewed atherosclerosis seen on CT chest.  Patient intolerant to Crestor.  we will try Zocor.  Advised consult cardiology for further risk stratification's including CT calcium score.  Patient declines at this time and would be interested in the new year . She will let me know when I can place referral

## 2022-07-03 NOTE — Patient Instructions (Addendum)
Send me a Pharmacist, community message when I can place referral to cardiology  I have sent in simvastatin for you to trial which is a different statin and hopefully do not experience body aches/joint pain.   Start prednisone taper tomorrow   As discussed, let's start by scheduling Tylenol Arthritis which is a '650mg'$  tablet .   You may take 1-2 tablets every 8 hours ( scheduled) with maximum of 6 tablets per day.   For example , you could take two tablets in the morning ( 8am) and then two tablets again at 4pm.   Maximum daily dose of acetaminophen 4 g per day from all sources.  If you are taking another medication which includes acetaminophen (Tylenol) which may be in cough and cold preparations or pain medication such as Percocet, you will need to factor that into your total daily dose to be safe.  Please let me know if any questions

## 2022-07-03 NOTE — Assessment & Plan Note (Addendum)
Acute on chronic. No alarm features.  Symptoms suggestive of degenerative disc disease, stenosis , sacroiliac joint dysfunction.  Pending bilateral hip x-ray and lumbar spine.  We will start prednisone taper.  Counseled on how to take medication and she will start tomorrow in the morning.  Refilled Flexeril.  She will let me know if no resolution of symptoms as would consider MRI, physical therapy and orthopedic consult.

## 2022-07-03 NOTE — Progress Notes (Signed)
Subjective:    Patient ID: Heather Pope, female    DOB: [birth year], 56 y.o.   MRN: [id number]  CC: Heather Pope is a 56 y.o. female who presents today for an acute visit.    HPI:  Right lateral hip pain, radiates to right groin x 2 months.  Worse when takes first step. Improves with rest.  She has had episode similar to this in the past.   Tried biofreeze, heat/oce, essential oils without relief  She is intolerant to aleve  Denies abdominal pain, dysuria, saddle anestheisia, numbness in legs, trouble urinating, fecal Incontinence.  Flexeril is helpful. She requests refill  H/o lumbar fusion  She has been on prednisone in the past for similar episode of low back pain.  She has done quite well on medication and tolerated medication  Very rare use of xanax. She requests refill.   No alcohol use.   Atherosclerosis-intolerant to Crestor 5 mg due to arthralgia.  No chest pain  Family history of heart disease  HISTORY:  Past Medical History:  Diagnosis Date   Arthritis    lower back   Endometriosis    GERD (gastroesophageal reflux disease)    PONV (postoperative nausea and vomiting)    Renal cyst    Past Surgical History:  Procedure Laterality Date   ABDOMINAL HYSTERECTOMY     for endometriosis; Dr. Vernie Ammons, one ovary in place ( unsure which side), NO cervix per patient. No h/o abnormal pap smear   BREAST SURGERY     Dr. Koleen Nimrod, normal   COLONOSCOPY WITH PROPOFOL N/A 03/25/2020   Procedure: COLONOSCOPY WITH PROPOFOL;  Surgeon: Lucilla Lame, MD;  Location: [facility];  Service: Endoscopy;  Laterality: N/A;  priority 4   NASAL SEPTOPLASTY W/ TURBINOPLASTY Bilateral 07/23/2018   Procedure: NASAL SEPTOPLASTY WITH TURBINATE REDUCTION;  Surgeon: Carloyn Manner, MD;  Location: [facility];  Service: ENT;  Laterality: Bilateral;   POLYPECTOMY  03/25/2020   Procedure: POLYPECTOMY;  Surgeon: Lucilla Lame, MD;  Location: [facility];  Service: Endoscopy;;   SPINAL FUSION     SPINE SURGERY  2011   L4-5, S1 fusion, Dr. Mauri Pole   VAGINAL DELIVERY     3   Family History  Problem Relation Age of Onset   Heart disease Mother    Diabetes Mother    Heart disease Maternal Grandmother    Heart disease Maternal Grandfather        CABG   Multiple sclerosis Daughter    Cancer Maternal Aunt        brain   Breast cancer Neg Hx     Allergies: Ibuprofen, Levaquin [levofloxacin in d5w], Oxycodone, and Penicillins Current Outpatient Medications on File Prior to Visit  Medication Sig Dispense Refill   acetaminophen (TYLENOL) 500 MG tablet Take 500 mg by mouth every 6 (six) hours as needed. 2 tablets three times daily      famotidine (PEPCID AC) 10 MG tablet Take 1 tablet (10 mg total) by mouth 2 (two) times daily. 60 tablet 2   No current facility-administered medications on file prior to visit.    Social History   Tobacco Use   Smoking status: Every Day    Packs/day: 0.25    Years: 44.00    Total pack years: 11.00    Types: Cigarettes   Smokeless tobacco: Never  Vaping Use   Vaping Use: Never used  Substance Use Topics   Alcohol use: No   Drug use: No  Review of Systems  Constitutional:  Negative for chills and fever.  Respiratory:  Negative for cough.   Cardiovascular:  Negative for chest pain and palpitations.  Gastrointestinal:  Negative for nausea and vomiting.  Genitourinary:  Negative for difficulty urinating.  Musculoskeletal:  Positive for back pain.  Neurological:  Negative for numbness.      Objective:    BP 138/78 (BP Location: Left Arm, Patient Position: Sitting, Cuff Size: Normal)   Pulse 70   Temp (!) 97.4 F (36.3 C) (Oral)   Ht '5\' 8"'$  (1.727 m)   Wt 208 lb 9.6 oz (94.6 kg)   SpO2 99%   BMI 31.72 kg/m    Physical Exam Vitals reviewed.  Constitutional:      Appearance: She is well-developed.  Eyes:     Conjunctiva/sclera: Conjunctivae normal.  Cardiovascular:     Rate  and Rhythm: Normal rate and regular rhythm.     Pulses: Normal pulses.     Heart sounds: Normal heart sounds.  Pulmonary:     Effort: Pulmonary effort is normal.     Breath sounds: Normal breath sounds. No wheezing, rhonchi or rales.  Musculoskeletal:     Lumbar back: Tenderness present. No swelling, edema, spasms or bony tenderness. Normal range of motion. Negative right straight leg raise test and negative left straight leg raise test.       Back:     Comments: Full range of motion with flexion, tension, lateral side bends. No bony tenderness.  Slight tenderness noted over right SI joint No pain, numbness, tingling elicited with single leg raise bilaterally.   Skin:    General: Skin is warm and dry.  Neurological:     Mental Status: She is alert.     Sensory: No sensory deficit.     Deep Tendon Reflexes:     Reflex Scores:      Patellar reflexes are 2+ on the right side and 2+ on the left side.    Comments: Sensation and strength intact bilateral lower extremities.  Psychiatric:        Speech: Speech normal.        Behavior: Behavior normal.        Thought Content: Thought content normal.        Assessment & Plan:   Problem List Items Addressed This Visit       Cardiovascular and Mediastinum   Atherosclerosis of aorta ([place])    Reviewed atherosclerosis seen on CT chest.  Patient intolerant to Crestor.  we will try Zocor.  Advised consult cardiology for further risk stratification's including CT calcium score.  Patient declines at this time and would be interested in the new year . She will let me know when I can place referral      Relevant Medications   simvastatin (ZOCOR) 20 MG tablet   Other Relevant Orders   CBC with Differential/Platelet   Comprehensive metabolic panel   Hemoglobin A1c   Lipid panel   TSH   VITAMIN D 25 Hydroxy (Vit-D Deficiency, Fractures)     Other   Anxiety    Chronic, stable.  Very infrequent use of Xanax.  I have refilled today I looked  up patient on [place] Controlled Substances Reporting System PMP AWARE and saw no activity that raised concern of inappropriate use.        Relevant Medications   ALPRAZolam (XANAX) 0.5 MG tablet   Low back pain - Primary    Acute on chronic. No alarm features.  Symptoms suggestive of degenerative disc disease, stenosis , sacroiliac joint dysfunction.  Pending bilateral hip x-ray and lumbar spine.  We will start prednisone taper.  Counseled on how to take medication and she will start tomorrow in the morning.  Refilled Flexeril.  She will let me know if no resolution of symptoms as would consider MRI, physical therapy and orthopedic consult.      Relevant Medications   cyclobenzaprine (FLEXERIL) 5 MG tablet   predniSONE (DELTASONE) 10 MG tablet   Other Relevant Orders   DG HIPS BILAT W OR W/O PELVIS 3-4 VIEWS   DG Lumbar Spine Complete      I have discontinued Brittnay L. Cormier's Cholecalciferol, rosuvastatin, and olopatadine. I am also having her start on simvastatin and predniSONE. Additionally, I am having her maintain her acetaminophen, famotidine, ALPRAZolam, and cyclobenzaprine.   Meds ordered this encounter  Medications   simvastatin (ZOCOR) 20 MG tablet    Sig: Take 1 tablet (20 mg total) by mouth every evening.    Dispense:  90 tablet    Refill:  3    Order Specific Question:   Supervising Provider    Answer:   Deborra Medina L [[id number]]   ALPRAZolam (XANAX) 0.5 MG tablet    Sig: TAKE 1 TABLET(0.5 MG) BY MOUTH DAILY AS NEEDED FOR ANXIETY    Dispense:  30 tablet    Refill:  0    Order Specific Question:   Supervising Provider    Answer:   Crecencio Mc [[id number]]   cyclobenzaprine (FLEXERIL) 5 MG tablet    Sig: Take 1 tablet (5 mg total) by mouth daily as needed for muscle spasms.    Dispense:  30 tablet    Refill:  2    Order Specific Question:   Supervising Provider    Answer:   Deborra Medina L [[id number]]   predniSONE (DELTASONE) 10 MG tablet    Sig: Take 4 tablets ( total 40  mg) by mouth for 2 days; take 3 tablets ( total 30 mg) by mouth for 2 days; take 2 tablets ( total 20 mg) by mouth for 1 day; take 1 tablet ( total 10 mg) by mouth for 1 day.    Dispense:  17 tablet    Refill:  0    Order Specific Question:   Supervising Provider    Answer:   Crecencio Mc [[id number]]    Return precautions given.   Risks, benefits, and alternatives of the medications and treatment plan prescribed today were discussed, and patient expressed understanding.   Education regarding symptom management and diagnosis given to patient on AVS.  Continue to follow with Burnard Hawthorne, FNP for routine health maintenance.   Heather Pope and I agreed with plan.   Mable Paris, FNP

## 2022-07-09 ENCOUNTER — Encounter: Payer: Self-pay | Admitting: Family

## 2022-08-09 ENCOUNTER — Other Ambulatory Visit (INDEPENDENT_AMBULATORY_CARE_PROVIDER_SITE_OTHER): Payer: BC Managed Care – PPO

## 2022-08-09 DIAGNOSIS — I700 Atherosclerosis of aorta: Secondary | ICD-10-CM | POA: Diagnosis not present

## 2022-08-09 LAB — LIPID PANEL
Cholesterol: 178 mg/dL (ref 0–200)
HDL: 61.4 mg/dL (ref >39.00)
LDL Cholesterol: 98 mg/dL (ref 0–99)
NonHDL: 117.02
Total CHOL/HDL Ratio: 3
Triglycerides: 97 mg/dL (ref 0.0–149.0)
VLDL: 19.4 mg/dL (ref 0.0–40.0)

## 2022-08-09 LAB — CBC WITH DIFFERENTIAL/PLATELET
Basophils Absolute: 0 10*3/uL (ref 0.0–0.1)
Basophils Relative: 0.8 % (ref 0.0–3.0)
Eosinophils Absolute: 0.2 10*3/uL (ref 0.0–0.7)
Eosinophils Relative: 3.1 % (ref 0.0–5.0)
HCT: 43.2 % (ref 36.0–46.0)
Hemoglobin: 14.1 g/dL (ref 12.0–15.0)
Lymphocytes Relative: 28.7 % (ref 12.0–46.0)
Lymphs Abs: 1.7 10*3/uL (ref 0.7–4.0)
MCHC: 32.6 g/dL (ref 30.0–36.0)
MCV: 89.2 fl (ref 78.0–100.0)
Monocytes Absolute: 0.4 10*3/uL (ref 0.1–1.0)
Monocytes Relative: 6.5 % (ref 3.0–12.0)
Neutro Abs: 3.6 10*3/uL (ref 1.4–7.7)
Neutrophils Relative %: 60.9 % (ref 43.0–77.0)
Platelets: 248 10*3/uL (ref 150.0–400.0)
RBC: 4.84 Mil/uL (ref 3.87–5.11)
RDW: 14.1 % (ref 11.5–15.5)
WBC: 5.8 10*3/uL (ref 4.0–10.5)

## 2022-08-09 LAB — COMPREHENSIVE METABOLIC PANEL
ALT: 11 U/L (ref 0–35)
AST: 15 U/L (ref 0–37)
Albumin: 4.3 g/dL (ref 3.5–5.2)
Alkaline Phosphatase: 57 U/L (ref 39–117)
BUN: 16 mg/dL (ref 6–23)
CO2: 28 mEq/L (ref 19–32)
Calcium: 9.5 mg/dL (ref 8.4–10.5)
Chloride: 104 mEq/L (ref 96–112)
Creatinine, Ser: 0.79 mg/dL (ref 0.40–1.20)
GFR: 82.62 mL/min (ref >60.00)
Glucose, Bld: 91 mg/dL (ref 70–99)
Potassium: 4.4 mEq/L (ref 3.5–5.1)
Sodium: 139 mEq/L (ref 135–145)
Total Bilirubin: 0.5 mg/dL (ref 0.2–1.2)
Total Protein: 6.6 g/dL (ref 6.0–8.3)

## 2022-08-09 LAB — TSH: TSH: 4.17 u[IU]/mL (ref 0.35–5.50)

## 2022-08-09 LAB — VITAMIN D 25 HYDROXY (VIT D DEFICIENCY, FRACTURES): VITD: 13.47 ng/mL — ABNORMAL LOW (ref 30.00–100.00)

## 2022-08-09 LAB — HEMOGLOBIN A1C: Hgb A1c MFr Bld: 5.8 % (ref 4.6–6.5)

## 2022-08-14 ENCOUNTER — Ambulatory Visit (INDEPENDENT_AMBULATORY_CARE_PROVIDER_SITE_OTHER): Payer: BC Managed Care – PPO | Admitting: Family

## 2022-08-14 ENCOUNTER — Other Ambulatory Visit: Payer: BC Managed Care – PPO

## 2022-08-14 ENCOUNTER — Encounter: Payer: Self-pay | Admitting: Family

## 2022-08-14 VITALS — BP 130/70 | HR 73 | Temp 97.8°F | Ht 68.0 in | Wt 211.4 lb

## 2022-08-14 DIAGNOSIS — Z716 Tobacco abuse counseling: Secondary | ICD-10-CM | POA: Diagnosis not present

## 2022-08-14 DIAGNOSIS — I700 Atherosclerosis of aorta: Secondary | ICD-10-CM | POA: Diagnosis not present

## 2022-08-14 DIAGNOSIS — E559 Vitamin D deficiency, unspecified: Secondary | ICD-10-CM | POA: Diagnosis not present

## 2022-08-14 DIAGNOSIS — Z23 Encounter for immunization: Secondary | ICD-10-CM | POA: Diagnosis not present

## 2022-08-14 DIAGNOSIS — Z0000 Encounter for general adult medical examination without abnormal findings: Secondary | ICD-10-CM

## 2022-08-14 MED ORDER — CHOLECALCIFEROL 1.25 MG (50000 UT) PO TABS: ORAL_TABLET | ORAL | 0 refills | Status: DC

## 2022-08-14 MED ORDER — PRAVASTATIN SODIUM 10 MG PO TABS: 10.0000 mg | ORAL_TABLET | Freq: Every day | ORAL | 3 refills | Status: DC

## 2022-08-14 MED ORDER — ONDANSETRON 4 MG PO TBDP: 4.0000 mg | ORAL_TABLET | Freq: Three times a day (TID) | ORAL | 1 refills | Status: AC | PRN

## 2022-08-14 NOTE — Progress Notes (Signed)
Subjective:    Patient ID: Heather Pope, female    DOB: 07/09/1966, 56 y.o.   MRN: [id number]  CC: Heather Pope is a 56 y.o. female who presents today for physical exam.    HPI: She start zocor 9/26/202 and developed myalgias so she has since stopped. She was intolerant to crestor.  Her mother has a family history of heart disease  She smokes cigarettes and is interested in quitting. She smokes one half a pack per day. In the past she used nicotine gum however she had intermittent nausea and would like zofran to have on hand. Nause on nicotine was worse when had an empty stomach. She had nightmares on nicotine patch.     No new skin lesions  Colorectal Cancer Screening: UTD , repeat in 10 years Breast Cancer Screening: Mammogram UTD Cervical Cancer Screening: hysterectomy for endometriosis; Dr. Vernie Pope, one ovary in place ( unsure which side), NO cervix per patient. No h/o abnormal pap smear   Bone Health screening/DEXA for 65+: No increased fracture risk. Defer screening at this time.  Lung Cancer Screening: She participating program with last CT chest obtained 02/27/22          Tetanus - due; declines        Pneumococcal - Candidate for pcv20, declines  Labs: Screening labs done prior Exercise: No regular exercise. Plans to start walking.   Alcohol use:  none Smoking/tobacco use: Nonsmoker.     HISTORY:  Past Medical History:  Diagnosis Date   Arthritis    lower back   Endometriosis    GERD (gastroesophageal reflux disease)    PONV (postoperative nausea and vomiting)    Renal cyst     Past Surgical History:  Procedure Laterality Date   ABDOMINAL HYSTERECTOMY     for endometriosis; Dr. Vernie Pope, one ovary in place ( unsure which side), NO cervix per patient. No h/o abnormal pap smear   BREAST SURGERY     Dr. Koleen Nimrod, normal   COLONOSCOPY WITH PROPOFOL N/A 03/25/2020   Procedure: COLONOSCOPY WITH PROPOFOL;  Surgeon: Heather Lame, MD;   Location: [place];  Service: Endoscopy;  Laterality: N/A;  priority 4   NASAL SEPTOPLASTY W/ TURBINOPLASTY Bilateral 07/23/2018   Procedure: NASAL SEPTOPLASTY WITH TURBINATE REDUCTION;  Surgeon: Heather Manner, MD;  Location: [place];  Service: ENT;  Laterality: Bilateral;   POLYPECTOMY  03/25/2020   Procedure: POLYPECTOMY;  Surgeon: Heather Lame, MD;  Location: [place];  Service: Endoscopy;;   SPINAL FUSION     SPINE SURGERY  2011   L4-5, S1 fusion, Dr. Mauri Pole   VAGINAL DELIVERY     3   Family History  Problem Relation Age of Onset   Heart disease Mother    Diabetes Mother    Heart disease Maternal Grandmother    Heart disease Maternal Grandfather        CABG   Multiple sclerosis Daughter    Cancer Maternal Aunt        brain   Breast cancer Neg Hx       ALLERGIES: Ibuprofen, Levaquin [levofloxacin in d5w], Oxycodone, and Penicillins  Current Outpatient Medications on File Prior to Visit  Medication Sig Dispense Refill   acetaminophen (TYLENOL) 500 MG tablet Take 500 mg by mouth every 6 (six) hours as needed. 2 tablets three times daily      ALPRAZolam (XANAX) 0.5 MG tablet TAKE 1 TABLET(0.5 MG) BY MOUTH DAILY AS NEEDED FOR ANXIETY 30 tablet 0  cyclobenzaprine (FLEXERIL) 5 MG tablet Take 1 tablet (5 mg total) by mouth daily as needed for muscle spasms. 30 tablet 2   famotidine (PEPCID AC) 10 MG tablet Take 1 tablet (10 mg total) by mouth 2 (two) times daily. 60 tablet 2   predniSONE (DELTASONE) 10 MG tablet Take 4 tablets ( total 40 mg) by mouth for 2 days; take 3 tablets ( total 30 mg) by mouth for 2 days; take 2 tablets ( total 20 mg) by mouth for 1 day; take 1 tablet ( total 10 mg) by mouth for 1 day. 17 tablet 0   No current facility-administered medications on file prior to visit.    Social History   Tobacco Use   Smoking status: Every Day    Packs/day: 0.25    Years: 44.00    Total pack years: 11.00    Types: Cigarettes    Smokeless tobacco: Never  Vaping Use   Vaping Use: Never used  Substance Use Topics   Alcohol use: No   Drug use: No    Review of Systems  Constitutional:  Negative for chills, fever and unexpected weight change.  HENT:  Negative for congestion.   Respiratory:  Negative for cough.   Cardiovascular:  Negative for chest pain, palpitations and leg swelling.  Gastrointestinal:  Negative for nausea and vomiting.  Musculoskeletal:  Negative for arthralgias and myalgias.  Skin:  Negative for rash.  Neurological:  Negative for headaches.  Hematological:  Negative for adenopathy.  Psychiatric/Behavioral:  Negative for confusion.       Objective:    BP 130/70 (BP Location: Left Arm, Patient Position: Sitting, Cuff Size: Normal)   Pulse 73   Temp 97.8 F (36.6 C) (Oral)   Ht '5\' 8"'$  (1.727 m)   Wt 211 lb 6.4 oz (95.9 kg)   SpO2 97%   BMI 32.14 kg/m   BP Readings from Last 3 Encounters:  08/14/22 130/70  07/03/22 138/78  11/21/21 120/76   Wt Readings from Last 3 Encounters:  08/14/22 211 lb 6.4 oz (95.9 kg)  07/03/22 208 lb 9.6 oz (94.6 kg)  02/27/22 202 lb (91.6 kg)    Physical Exam Vitals reviewed.  Constitutional:      Appearance: Normal appearance. She is well-developed.  Eyes:     Conjunctiva/sclera: Conjunctivae normal.  Neck:     Thyroid: No thyroid mass or thyromegaly.  Cardiovascular:     Rate and Rhythm: Normal rate and regular rhythm.     Pulses: Normal pulses.     Heart sounds: Normal heart sounds.  Pulmonary:     Effort: Pulmonary effort is normal.     Breath sounds: Normal breath sounds. No wheezing, rhonchi or rales.  Chest:  Breasts:    Breasts are symmetrical.     Right: No inverted nipple, mass, nipple discharge, skin change or tenderness.     Left: No inverted nipple, mass, nipple discharge, skin change or tenderness.  Abdominal:     General: Bowel sounds are normal. There is no distension.     Palpations: Abdomen is soft. Abdomen is not rigid.  There is no fluid wave or mass.     Tenderness: There is no abdominal tenderness. There is no guarding or rebound.  Lymphadenopathy:     Head:     Right side of head: No submental, submandibular, tonsillar, preauricular, posterior auricular or occipital adenopathy.     Left side of head: No submental, submandibular, tonsillar, preauricular, posterior auricular or occipital adenopathy.  Cervical: No cervical adenopathy.     Right cervical: No superficial, deep or posterior cervical adenopathy.    Left cervical: No superficial, deep or posterior cervical adenopathy.  Skin:    General: Skin is warm and dry.  Neurological:     Mental Status: She is alert.  Psychiatric:        Speech: Speech normal.        Behavior: Behavior normal.        Thought Content: Thought content normal.        Assessment & Plan:   Problem List Items Addressed This Visit       Cardiovascular and Mediastinum   Atherosclerosis of aorta (HCC)    Trial of pravastatin has previously intolerant to Zocor, Crestor.  Patient will start pravastatin 10 mg 3 days weekly and gradually increase.  We will recheck LFTs at follow-up      Relevant Medications   pravastatin (PRAVACHOL) 10 MG tablet     Other   Encounter for smoking cessation counseling    Patient had success on nicotine gum in the past.  Counseled her that perhaps nicotine milligram is too high causing nausea.  I have provided her with Zofran if she were to need.  She will let me know if she does not achieve smoking cessation and we can discuss Wellbutrin, Chantix in greater detail.      Relevant Medications   ondansetron (ZOFRAN-ODT) 4 MG disintegrating tablet   Routine physical examination    Clinical breast exam performed today.  Encouraged walking program.      Other Visit Diagnoses     Vitamin D deficiency    -  Primary   Relevant Medications   Cholecalciferol 1.25 MG (50000 UT) TABS   Need for immunization against influenza        Relevant Orders   Flu Vaccine QUAD 7moIM (Fluarix, Fluzone & Alfiuria Quad PF) (Completed)        I have discontinued Sherral L. Champoux's simvastatin. I am also having her start on Cholecalciferol, pravastatin, and ondansetron. Additionally, I am having her maintain her acetaminophen, famotidine, ALPRAZolam, cyclobenzaprine, and predniSONE.   Meds ordered this encounter  Medications   Cholecalciferol 1.25 MG (50000 UT) TABS    Sig: 50,000 units PO qwk for 8 weeks.    Dispense:  8 tablet    Refill:  0   pravastatin (PRAVACHOL) 10 MG tablet    Sig: Take 1 tablet (10 mg total) by mouth daily.    Dispense:  90 tablet    Refill:  3   ondansetron (ZOFRAN-ODT) 4 MG disintegrating tablet    Sig: Take 1 tablet (4 mg total) by mouth every 8 (eight) hours as needed for nausea or vomiting.    Dispense:  30 tablet    Refill:  1    Return precautions given.   Risks, benefits, and alternatives of the medications and treatment plan prescribed today were discussed, and patient expressed understanding.   Education regarding symptom management and diagnosis given to patient on AVS.   Continue to follow with ABurnard Hawthorne FNP for routine health maintenance.   JKelvin Cellarand I agreed with plan.   MMable Paris FNP

## 2022-08-14 NOTE — Assessment & Plan Note (Addendum)
Trial of pravastatin has previously intolerant to Zocor, Crestor.  Patient will start pravastatin 10 mg 3 days weekly and gradually increase.  We will recheck LFTs at follow-up

## 2022-08-14 NOTE — Assessment & Plan Note (Signed)
Patient had success on nicotine gum in the past.  Counseled her that perhaps nicotine milligram is too high causing nausea.  I have provided her with Zofran if she were to need.  She will let me know if she does not achieve smoking cessation and we can discuss Wellbutrin, Chantix in greater detail.

## 2022-08-14 NOTE — Patient Instructions (Addendum)
Your vitamin D level is low.  You may start prescription for vitamin  D 50000 units by mouth ONCE weekly for 8 weeks only. I have sent this to your pharmacy. After 8 weeks, you MAY stop this dose and resume over the counter cholecalciferol 800 units daily. Please call our office for a follow up visit and we can recheck level in a 3-4 months.  Trial of pravastatin 10 mg 3 times per week.  May gradually increase to daily, very slowly  Zofran as needed for nausea.  I would suspect nicotine milligram is too high if you are feeling nausea regularly.  Health Maintenance for Postmenopausal Women Menopause is a normal process in which your ability to get pregnant comes to an end. This process happens slowly over many months or years, usually between the ages of 44 and 26. Menopause is complete when you have missed your menstrual period for 12 months. It is important to talk with your health care provider about some of the most common conditions that affect women after menopause (postmenopausal women). These include heart disease, cancer, and bone loss (osteoporosis). Adopting a healthy lifestyle and getting preventive care can help to promote your health and wellness. The actions you take can also lower your chances of developing some of these common conditions. What are the signs and symptoms of menopause? During menopause, you may have the following symptoms: Hot flashes. These can be moderate or severe. Night sweats. Decrease in sex drive. Mood swings. Headaches. Tiredness (fatigue). Irritability. Memory problems. Problems falling asleep or staying asleep. Talk with your health care provider about treatment options for your symptoms. Do I need hormone replacement therapy? Hormone replacement therapy is effective in treating symptoms that are caused by menopause, such as hot flashes and night sweats. Hormone replacement carries certain risks, especially as you become older. If you are thinking about  using estrogen or estrogen with progestin, discuss the benefits and risks with your health care provider. How can I reduce my risk for heart disease and stroke? The risk of heart disease, heart attack, and stroke increases as you age. One of the causes may be a change in the body's hormones during menopause. This can affect how your body uses dietary fats, triglycerides, and cholesterol. Heart attack and stroke are medical emergencies. There are many things that you can do to help prevent heart disease and stroke. Watch your blood pressure High blood pressure causes heart disease and increases the risk of stroke. This is more likely to develop in people who have high blood pressure readings or are overweight. Have your blood pressure checked: Every 3-5 years if you are 4-72 years of age. Every year if you are 13 years old or older. Eat a healthy diet  Eat a diet that includes plenty of vegetables, fruits, low-fat dairy products, and lean protein. Do not eat a lot of foods that are high in solid fats, added sugars, or sodium. Get regular exercise Get regular exercise. This is one of the most important things you can do for your health. Most adults should: Try to exercise for at least 150 minutes each week. The exercise should increase your heart rate and make you sweat (moderate-intensity exercise). Try to do strengthening exercises at least twice each week. Do these in addition to the moderate-intensity exercise. Spend less time sitting. Even light physical activity can be beneficial. Other tips Work with your health care provider to achieve or maintain a healthy weight. Do not use any products that  contain nicotine or tobacco. These products include cigarettes, chewing tobacco, and vaping devices, such as e-cigarettes. If you need help quitting, ask your health care provider. Know your numbers. Ask your health care provider to check your cholesterol and your blood sugar (glucose). Continue to  have your blood tested as directed by your health care provider. Do I need screening for cancer? Depending on your health history and family history, you may need to have cancer screenings at different stages of your life. This may include screening for: Breast cancer. Cervical cancer. Lung cancer. Colorectal cancer. What is my risk for osteoporosis? After menopause, you may be at increased risk for osteoporosis. Osteoporosis is a condition in which bone destruction happens more quickly than new bone creation. To help prevent osteoporosis or the bone fractures that can happen because of osteoporosis, you may take the following actions: If you are 92-63 years old, get at least 1,000 mg of calcium and at least 600 international units (IU) of vitamin D per day. If you are older than age 20 but younger than age 65, get at least 1,200 mg of calcium and at least 600 international units (IU) of vitamin D per day. If you are older than age 49, get at least 1,200 mg of calcium and at least 800 international units (IU) of vitamin D per day. Smoking and drinking excessive alcohol increase the risk of osteoporosis. Eat foods that are rich in calcium and vitamin D, and do weight-bearing exercises several times each week as directed by your health care provider. How does menopause affect my mental health? Depression may occur at any age, but it is more common as you become older. Common symptoms of depression include: Feeling depressed. Changes in sleep patterns. Changes in appetite or eating patterns. Feeling an overall lack of motivation or enjoyment of activities that you previously enjoyed. Frequent crying spells. Talk with your health care provider if you think that you are experiencing any of these symptoms. General instructions See your health care provider for regular wellness exams and vaccines. This may include: Scheduling regular health, dental, and eye exams. Getting and maintaining your  vaccines. These include: Influenza vaccine. Get this vaccine each year before the flu season begins. Pneumonia vaccine. Shingles vaccine. Tetanus, diphtheria, and pertussis (Tdap) booster vaccine. Your health care provider may also recommend other immunizations. Tell your health care provider if you have ever been abused or do not feel safe at home. Summary Menopause is a normal process in which your ability to get pregnant comes to an end. This condition causes hot flashes, night sweats, decreased interest in sex, mood swings, headaches, or lack of sleep. Treatment for this condition may include hormone replacement therapy. Take actions to keep yourself healthy, including exercising regularly, eating a healthy diet, watching your weight, and checking your blood pressure and blood sugar levels. Get screened for cancer and depression. Make sure that you are up to date with all your vaccines. This information is not intended to replace advice given to you by your health care provider. Make sure you discuss any questions you have with your health care provider. Document Revised: 02/13/2021 Document Reviewed: 02/13/2021 Elsevier Patient Education  [place].

## 2022-08-14 NOTE — Assessment & Plan Note (Signed)
Clinical breast exam performed today.  Encouraged walking program.

## 2022-10-15 ENCOUNTER — Ambulatory Visit: Payer: BC Managed Care – PPO | Admitting: Family

## 2022-10-16 ENCOUNTER — Other Ambulatory Visit: Payer: Self-pay | Admitting: Family

## 2022-10-17 NOTE — Telephone Encounter (Signed)
Refilled: 08/14/2022 Last OV: 08/14/2022 Next OV: not scheduled Last Vit. D lab: 08/09/2022

## 2022-11-20 ENCOUNTER — Ambulatory Visit: Payer: BC Managed Care – PPO

## 2022-11-20 ENCOUNTER — Ambulatory Visit: Payer: BC Managed Care – PPO | Admitting: Podiatry

## 2022-11-20 ENCOUNTER — Ambulatory Visit (INDEPENDENT_AMBULATORY_CARE_PROVIDER_SITE_OTHER): Payer: BC Managed Care – PPO

## 2022-11-20 DIAGNOSIS — M7672 Peroneal tendinitis, left leg: Secondary | ICD-10-CM

## 2022-11-20 DIAGNOSIS — M79672 Pain in left foot: Secondary | ICD-10-CM

## 2022-11-20 MED ORDER — BETAMETHASONE SOD PHOS & ACET 6 (3-3) MG/ML IJ SUSP
3.0000 mg | Freq: Once | INTRAMUSCULAR | Status: AC
  Administered 2022-11-20: 3 mg via INTRA_ARTICULAR

## 2022-11-20 MED ORDER — METHYLPREDNISOLONE 4 MG PO TBPK: ORAL_TABLET | ORAL | 0 refills | Status: DC

## 2022-11-20 NOTE — Progress Notes (Signed)
Chief Complaint  Patient presents with   Fracture    Patient came in today for a left foot injury which happened 2 months ago, patient states she thinks she stepped down wrong on the left ankle, the lateral side of the ankle and the top of the foot, rate of pain 8 out of 10 after working, X-rays taken today     HPI: 57 y.o. female presenting today as a new patient for evaluation of pain and tenderness to the lateral aspect of the left foot/ankle that has been ongoing for about 2 months now.  Idiopathic onset.  She denies a history of injury.  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  Currently the pain is 8/10 almost on a daily basis.  She has tried different ankle braces with minimal relief or improvement.  She is unable to take NSAIDs due to GI sensitivity  Past Medical History:  Diagnosis Date   Arthritis    lower back   Endometriosis    GERD (gastroesophageal reflux disease)    PONV (postoperative nausea and vomiting)    Renal cyst     Past Surgical History:  Procedure Laterality Date   ABDOMINAL HYSTERECTOMY     for endometriosis; Dr. Vernie Ammons, one ovary in place ( unsure which side), NO cervix per patient. No h/o abnormal pap smear   BREAST SURGERY     Dr. Koleen Nimrod, normal   COLONOSCOPY WITH PROPOFOL N/A 03/25/2020   Procedure: COLONOSCOPY WITH PROPOFOL;  Surgeon: Lucilla Lame, MD;  Location: [place];  Service: Endoscopy;  Laterality: N/A;  priority 4   NASAL SEPTOPLASTY W/ TURBINOPLASTY Bilateral 07/23/2018   Procedure: NASAL SEPTOPLASTY WITH TURBINATE REDUCTION;  Surgeon: Carloyn Manner, MD;  Location: [place];  Service: ENT;  Laterality: Bilateral;   POLYPECTOMY  03/25/2020   Procedure: POLYPECTOMY;  Surgeon: Lucilla Lame, MD;  Location: [place];  Service: Endoscopy;;   SPINAL FUSION     SPINE SURGERY  2011   L4-5, S1 fusion, Dr. Mauri Pole   VAGINAL DELIVERY     3    Allergies  Allergen Reactions    Ibuprofen    Levaquin [Levofloxacin In D5w]     Knee problems   Oxycodone     All narcotics makes blood pressure drop low   Penicillins      Physical Exam: General: The patient is alert and oriented x3 in no acute distress.  Dermatology: Skin is warm, dry and supple bilateral lower extremities. Negative for open lesions or macerations.  Vascular: Palpable pedal pulses bilaterally. Capillary refill within normal limits.  Negative for any significant edema or erythema  Neurological: Light touch and protective threshold grossly intact  Musculoskeletal Exam: No pedal deformities noted.  There is tenderness with palpation along the peroneal tendons just distal to the fibular malleolus.  There is no pain with palpation to the os peroneum visualized on radiographic exam.  Radiographic Exam LT foot and ankle 11/20/2022:  Normal osseous mineralization. Joint spaces preserved.  No fractures identified.  Os peroneum noted  Assessment: 1.  Peroneal tendinitis left   Plan of Care:  1. Patient evaluated. X-Rays reviewed.  2.  Injection of 0.5 cc Celestone Soluspan injected along the peroneal tendon sheath left just distal to the fibular malleolus 3.  Prescription for Medrol Dosepak 4.  Cam boot dispensed.  WBAT 5.  Recommend RICE is much as possible especially in the evenings 6.  Compression sleeve also dispensed. 7.  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 Edrick Kins, DPM    [address], [place] [address]                Office [phone]  Fax [phone]

## 2022-12-18 ENCOUNTER — Encounter: Payer: Self-pay | Admitting: Podiatry

## 2022-12-18 ENCOUNTER — Ambulatory Visit: Payer: BC Managed Care – PPO | Admitting: Podiatry

## 2022-12-18 VITALS — BP 127/79 | HR 65

## 2022-12-18 DIAGNOSIS — M7672 Peroneal tendinitis, left leg: Secondary | ICD-10-CM | POA: Diagnosis not present

## 2022-12-18 NOTE — Progress Notes (Signed)
Chief Complaint  Patient presents with   Foot Pain    "It doesn't feel good after being on it all day at work.  I want him to give me another one of the compression sock."    HPI: 57 y.o. female presenting today for follow-up evaluation of pain and tenderness to the lateral aspect of the left foot/ankle that has been ongoing for about 3 months now.  Idiopathic onset.  She denies a history of injury.  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  Currently the pain is 8/10 almost on a daily basis.  She has tried different ankle braces with minimal relief or improvement.  She is unable to take NSAIDs due to GI sensitivity  Patient states that since last visit on 11/20/2022 there has been some modest improvement.  She says the injection helped for about 2 weeks but the pain slowly returned.  She continues to have an antalgic gait during ambulation and work.  Past Medical History:  Diagnosis Date   Arthritis    lower back   Endometriosis    GERD (gastroesophageal reflux disease)    PONV (postoperative nausea and vomiting)    Renal cyst     Past Surgical History:  Procedure Laterality Date   ABDOMINAL HYSTERECTOMY     for endometriosis; Dr. Vernie Ammons, one ovary in place ( unsure which side), NO cervix per patient. No h/o abnormal pap smear   BREAST SURGERY     Dr. Koleen Nimrod, normal   COLONOSCOPY WITH PROPOFOL N/A 03/25/2020   Procedure: COLONOSCOPY WITH PROPOFOL;  Surgeon: Lucilla Lame, MD;  Location: [place];  Service: Endoscopy;  Laterality: N/A;  priority 4   NASAL SEPTOPLASTY W/ TURBINOPLASTY Bilateral 07/23/2018   Procedure: NASAL SEPTOPLASTY WITH TURBINATE REDUCTION;  Surgeon: Carloyn Manner, MD;  Location: [place];  Service: ENT;  Laterality: Bilateral;   POLYPECTOMY  03/25/2020   Procedure: POLYPECTOMY;  Surgeon: Lucilla Lame, MD;  Location: [place];  Service: Endoscopy;;   SPINAL FUSION     SPINE SURGERY  2011    L4-5, S1 fusion, Dr. Mauri Pole   VAGINAL DELIVERY     3    Allergies  Allergen Reactions   Ibuprofen    Levaquin [Levofloxacin In D5w]     Knee problems   Oxycodone     All narcotics makes blood pressure drop low   Penicillins      Physical Exam: General: The patient is alert and oriented x3 in no acute distress.  Dermatology: Skin is warm, dry and supple bilateral lower extremities. Negative for open lesions or macerations.  Vascular: Palpable pedal pulses bilaterally. Capillary refill within normal limits.  Negative for any significant edema or erythema  Neurological: Light touch and protective threshold grossly intact  Musculoskeletal Exam: No pedal deformities noted.  There continues to be tenderness with palpation along the peroneal tendons just distal to the fibular malleolus.  There is no pain with palpation to the os peroneum visualized on radiographic exam.  Radiographic Exam LT foot and ankle 11/20/2022:  Normal osseous mineralization. Joint spaces preserved.  No fractures identified.  Os peroneum noted  Assessment: 1.  Peroneal tendinitis left  -Patient evaluated -Patient declined cortisone injection today -No NSAIDs.  Patient has GI sensitivity to all NSAIDs including Tylenol arthritis -Continue compression ankle sleeve.  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 Edrick Kins, DPM    [address], [place] [address]                Office [phone]  Fax [phone]

## 2023-01-08 ENCOUNTER — Ambulatory Visit: Payer: BC Managed Care – PPO | Admitting: Podiatry

## 2023-01-14 ENCOUNTER — Ambulatory Visit (INDEPENDENT_AMBULATORY_CARE_PROVIDER_SITE_OTHER): Payer: BC Managed Care – PPO | Admitting: Podiatry

## 2023-01-14 ENCOUNTER — Encounter: Payer: Self-pay | Admitting: Podiatry

## 2023-01-14 DIAGNOSIS — M7672 Peroneal tendinitis, left leg: Secondary | ICD-10-CM | POA: Diagnosis not present

## 2023-01-14 NOTE — Patient Instructions (Addendum)
Look for an "EvenUp" shoe attachment on [employer] or at [employer]. This will level out your hips while you are walking in the CAM boot. Wear this on the other foot around a supportive sneaker:     Peroneal Tendinopathy Rehab Ask your health care provider which exercises are safe for you. Do exercises exactly as told by your health care provider and adjust them as directed. It is normal to feel mild stretching, pulling, tightness, or discomfort as you do these exercises. Stop right away if you feel sudden pain or your pain gets worse. Do not begin these exercises until told by your health care provider. Stretching and range-of-motion exercises These exercises warm up your muscles and joints and improve the movement and flexibility of your ankle. These exercises also help to relieve pain and stiffness. Gastroc and soleus stretch, standing  This is an exercise in which you stand on a step and use your body weight to stretch your calf muscles. To do this exercise: Stand on the edge of a step on the ball of your left / right foot. The ball of your foot is on the walking surface, right under your toes. Keep your other foot firmly on the same step. Hold on to the wall, a railing, or a chair for balance. Slowly lift your other foot, allowing your body weight to press your left / right heel down over the edge of the step. You should feel a stretch in your left / right calf (gastrocnemius and soleus). Hold this position for 15 seconds. Return both feet to the step. Repeat this exercise with a slight bend in your left / right knee. Repeat 5 times with your left / right knee straight and 5 times with your left / right knee bent. Complete this exercise 2 times a day. Strengthening exercises These exercises build strength and endurance in your foot and ankle. Endurance is the ability to use your muscles for a long time, even after they get tired. Ankle dorsiflexion with band   Secure a rubber exercise band or  tube to an object, such as a table leg, that will not move when the band is pulled. Secure the other end of the band around your left / right foot. Sit on the floor, facing the object with your left / right leg extended. The band or tube should be slightly tense when your foot is relaxed. Slowly flex your left / right ankle and toes to bring your foot toward you (dorsiflexion). Hold this position for 15 seconds. Let the band or tube slowly pull your foot back to the starting position. Repeat 5 times. Complete this exercise 2 times a day. Ankle eversion Sit on the floor with your legs straight out in front of you. Loop a rubber exercise band or tube around the ball of your left / right foot. The ball of your foot is on the walking surface, right under your toes. Hold the ends of the band in your hands, or secure the band to a stable object. The band or tube should be slightly tense when your foot is relaxed. Slowly push your foot outward, away from your other leg (eversion). Hold this position for 15 seconds. Slowly return your foot to the starting position. Repeat 5 times. Complete this exercise 2 times a day. Plantar flexion, standing  This exercise is sometimes called standing heel raise. Stand with your feet shoulder-width apart. Place your hands on a wall or table to steady yourself as needed, but try  not to use it for support. Keep your weight spread evenly over the width of your feet while you slowly rise up on your toes (plantar flexion). If told by your health care provider: Shift your weight toward your left / right leg until you feel challenged. Stand on your left / right leg only. Hold this position for 15 seconds. Repeat 2 times. Complete this exercise 2 times a day. Single leg stand Without shoes, stand near a railing or in a doorway. You may hold on to the railing or door frame as needed. Stand on your left / right foot. Keep your big toe down on the floor and try to keep  your arch lifted. Do not roll to the outside of your foot. If this exercise is too easy, you can try it with your eyes closed or while standing on a pillow. Hold this position for 15 seconds. Repeat 5 times. Complete this exercise 2 times a day. This information is not intended to replace advice given to you by your health care provider. Make sure you discuss any questions you have with your health care provider. Document Revised: 01/13/2019 Document Reviewed: 01/13/2019 Elsevier Patient Education  2020 [employer].

## 2023-01-14 NOTE — Progress Notes (Signed)
  Subjective:  Patient ID: Heather Pope, female    DOB: 1966-07-26,  MRN: [id number]  Chief Complaint  Patient presents with   Foot Pain    "It's killing me.  I need a shot in my foot."    57 y.o. female presents with the above complaint. History confirmed with patient.  She had an injection at the beginning of February and the tendon area and this helped quite a lot.  After last visit did not need 1 but has worsened again.  Has been on her feet a lot.  Has not been able to wear boot due to work activity level.  Objective:  Physical Exam: warm, good capillary refill, no trophic changes or ulcerative lesions, normal DP and PT pulses, normal sensory exam, and tenderness along the peroneal tendons distal to the malleolus   Assessment:   1. Peroneal tendinitis, left      Plan:  Patient was evaluated and treated and all questions answered.  We discussed further treatment options of her peroneal tendinitis.  I discussed with her that second injection today is possible but after this would not recommend further injection therapy as possibility of tearing is possible if she is not showing signs of persistent relief, I expect tenosynovitis to resolve with injection therapy not be recurrent issue.  Discussed with her if not improving after the next visit would recommend boot immobilization, physical therapy, and an MRI.  She will follow-up with Dr. Logan Bores as scheduled in 6 weeks.  Following consent and preparation of the area with alcohol 4 milligrams of dexamethasone and 0.5 cc of 2% lidocaine plain was injected along the peroneal tendon sheath.  She tolerated this well, she will continue using compression sleeves for support.  Return in about 6 weeks (around 02/25/2023) for re-check peroneal tendinitis.

## 2023-01-22 ENCOUNTER — Ambulatory Visit: Payer: BC Managed Care – PPO | Admitting: Podiatry

## 2023-01-29 ENCOUNTER — Ambulatory Visit: Payer: BC Managed Care – PPO | Admitting: Podiatry

## 2023-01-31 ENCOUNTER — Other Ambulatory Visit: Payer: Self-pay | Admitting: Family

## 2023-01-31 DIAGNOSIS — Z1231 Encounter for screening mammogram for malignant neoplasm of breast: Secondary | ICD-10-CM

## 2023-02-11 ENCOUNTER — Ambulatory Visit: Payer: BC Managed Care – PPO | Admitting: Family

## 2023-02-26 ENCOUNTER — Encounter: Payer: Self-pay | Admitting: Family

## 2023-02-26 ENCOUNTER — Ambulatory Visit: Payer: BC Managed Care – PPO | Admitting: Family

## 2023-02-26 ENCOUNTER — Ambulatory Visit: Payer: BC Managed Care – PPO | Admitting: Podiatry

## 2023-02-26 ENCOUNTER — Telehealth: Payer: Self-pay | Admitting: Family

## 2023-02-26 VITALS — BP 128/82 | HR 79 | Temp 97.9°F | Ht 68.0 in | Wt 222.0 lb

## 2023-02-26 DIAGNOSIS — I700 Atherosclerosis of aorta: Secondary | ICD-10-CM | POA: Diagnosis not present

## 2023-02-26 DIAGNOSIS — Z716 Tobacco abuse counseling: Secondary | ICD-10-CM | POA: Diagnosis not present

## 2023-02-26 DIAGNOSIS — Z8639 Personal history of other endocrine, nutritional and metabolic disease: Secondary | ICD-10-CM | POA: Diagnosis not present

## 2023-02-26 LAB — VITAMIN D 25 HYDROXY (VIT D DEFICIENCY, FRACTURES): VITD: 12.36 ng/mL — ABNORMAL LOW (ref 30.00–100.00)

## 2023-02-26 NOTE — Patient Instructions (Addendum)
Please download Myfitness Pal App ( free). You may log every thing you eat for even 2-3 days to get a better of idea of true daily calories. To loose weight, you have to use more calories than than consumed and essentially create caloric deficit to loose weight. The goal is 1-2 lbs per week of weight loss.   You review below from [facility]   https://[email or web address].it  Calorie counting made easy  Eat less, exercise more. If only it were that simple! As most dieters know, losing weight can be very challenging. As this report details, a range of influences can affect how people gain and lose weight. But a basic understanding of how to tip your energy balance in favor of weight loss is a good place to start.  Start by determining how many calories you should consume each day. To do so, you need to know how many calories you need to maintain your current weight. Doing this requires a few simple calculations.  First, multiply your current weight by 15 -- that's roughly the number of calories per pound of body weight needed to maintain your current weight if you are moderately active. Moderately active means getting at least 30 minutes of physical activity a day in the form of exercise (walking at a brisk pace, climbing stairs, or active gardening). Let's say you're a woman who is 5 feet, 4 inches tall and weighs 155 pounds, and you need to lose about 15 pounds to put you in a healthy weight range. If you multiply 155 by 15, you will get 2,325, which is the number of calories per day that you need in order to maintain your current weight (weight-maintenance calories). To lose weight, you will need to get below that total.  For example, to lose 1 to 2 pounds a week -- a rate that experts consider safe -- your food consumption should provide 500 to 1,000 calories less than your total weight-maintenance calories. If you need 2,325 calories a day to maintain  your current weight, reduce your daily calories to between 1,325 and 1,825. If you are sedentary, you will also need to build more activity into your day. In order to lose at least a pound a week, try to do at least 30 minutes of physical activity on most days, and reduce your daily calorie intake by at least 500 calories. However, calorie intake should not fall below 1,200 a day in women or 1,500 a day in men, except under the supervision of a health professional. Eating too few calories can endanger your health by depriving you of needed nutrients.  Meeting your calorie target How can you meet your daily calorie target? One approach is to add up the number of calories per serving of all the foods that you eat, and then plan your menus accordingly. You can buy books that list calories per serving for many foods. In addition, the nutrition labels on all packaged foods and beverages provide calories per serving information. Make a point of reading the labels of the foods and drinks you use, noting the number of calories and the serving sizes. Many recipes published in cookbooks, newspapers, and magazines provide similar information.  If you hate counting calories, a different approach is to restrict how much and how often you eat, and to eat meals that are low in calories. Dietary guidelines issued by the American Heart Association stress common sense in choosing your foods rather than focusing strictly on numbers, such as total calories or  calories from fat. Whichever method you choose, research shows that a regular eating schedule -- with meals and snacks planned for certain times each day -- makes for the most successful approach. The same applies after you have lost weight and want to keep it off. Sticking with an eating schedule increases your chance of maintaining your new weight.    This is  Dr. Melina Schools  ( an amazing physician in my office!)  example of a  "Low GI"  Diet:  It will allow you to lose 4  to 8  lbs  per month if you follow it carefully.  Your goal with exercise is a minimum of 30 minutes of aerobic exercise 5 days per week (Walking does not count once it becomes easy!)    All of the foods can be found at grocery stores and in bulk at [employer].  The Atkins protein bars and shakes are available in more varieties at Target, WalMart and [employer] Foods.     7 AM Breakfast:  Choose from the following:  Low carbohydrate Protein  Shakes (I recommend the  Premier Protein chocolate shakes,  EAS AdvantEdge "Carb Control" shakes  Or the Atkins shakes all are under 3 net carbs)     a scrambled egg/bacon/cheese burrito made with Mission's "carb balance" whole wheat tortilla  (about 10 net carbs )  Medical laboratory scientific officer (basically a quiche without the pastry crust) that is eaten cold and very convenient way to get your eggs.  8 carbs)  If you make your own protein shakes, avoid bananas and pineapple,  And use low carb greek yogurt or original /unsweetened almond or soy milk    Avoid cereal and bananas, oatmeal and cream of wheat and grits. They are loaded with carbohydrates!   10 AM: high protein snack:  Protein bar by Atkins (the snack size, under 200 cal, usually < 6 net carbs).    A stick of cheese:  Around 1 carb,  100 cal     Dannon Light n Fit [place] Yogurt  (80 cal, 8 carbs)  Other so called "protein bars" and Greek yogurts tend to be loaded with carbohydrates.  Remember, in food advertising, the word "energy" is synonymous for " carbohydrate."  Lunch:   A Sandwich using the bread choices listed, Can use any  Eggs,  lunchmeat, grilled meat or canned tuna), avocado, regular mayo/mustard  and cheese.  A Salad using blue cheese, ranch,  Goddess or vinagrette,  Avoid taco shells, croutons or "confetti" and no "candied nuts" but regular nuts OK.   No pretzels, nabs  or chips.  Pickles and miniature sweet peppers are a good low carb alternative that provide a "crunch"  The  bread is the only source of carbohydrate in a sandwich and  can be decreased by trying some of the attached alternatives to traditional loaf bread   Avoid "Low fat dressings, as well as Reyne Dumas and [employer] dressings They are loaded with sugar!   3 PM/ Mid day  Snack:  Consider  1 ounce of  almonds, walnuts, pistachios, pecans, peanuts,  Macadamia nuts or a nut medley.  Avoid "granola and granola bars "  Mixed nuts are ok in moderation as long as there are no raisins,  cranberries or dried fruit.   KIND bars are OK if you get the low glycemic index variety   Try the prosciutto/mozzarella cheese sticks by F[employer]ci  In deli /backery section   High protein  6 PM  Dinner:     Meat/fowl/fish with a green salad, and either broccoli, cauliflower, green beans, spinach, brussel sprouts or  Lima beans. DO NOT BREAD THE PROTEIN!!      There is a low carb pasta by Dreamfield's that is acceptable and tastes great: only 5 digestible carbs/serving.( All grocery stores but BJs carry it ) Several ready made meals are available low carb:   Try Michel Angelo's chicken piccata or chicken or eggplant parm over low carb pasta.(Lowes and BJs)   Clifton Custard Sanchez's "Carnitas" (pulled pork, no sauce,  0 carbs) or his beef pot roast to make a dinner burrito (at BJ's)  Pesto over low carb pasta (bj's sells a good quality pesto in the center refrigerated section of the deli   Try satueeing  Roosvelt Harps with mushroooms as a good side   [employer] makes a mashed cauliflower that tastes like mashed potatoes  Whole wheat pasta is still full of digestible carbs and  Not as low in glycemic index as Dreamfield's.   Brown rice is still rice,  So skip the rice and noodles if you eat [place] or [place] (or at least limit to 1/2 cup)  9 PM snack :   Breyer's "low carb" fudgsicle or  ice cream bar (Carb Smart line), or  Weight Watcher's ice cream bar , or another "no sugar added" ice cream;  a serving of fresh  berries/cherries with whipped cream   Cheese or DANNON'S LlGHT N FIT GREEK YOGURT  8 ounces of [place] Diamond unsweetened almond/cococunut milk    Treat yourself to a parfait made with whipped cream blueberiies, walnuts and vanilla greek yogurt  Avoid bananas, pineapple, grapes  and watermelon on a regular basis because they are high in sugar.  THINK OF THEM AS DESSERT  Remember that snack Substitutions should be less than 10 NET carbs per serving and meals < 20 carbs. Remember to subtract fiber grams to get the "net carbs."    For post menopausal women, guidelines recommend a diet with 1200 mg of Calcium per day. If you are eating calcium rich foods, you do not need a calcium supplement. The body better absorbs the calcium that you eat over supplementation. If you do supplement, I recommend not supplementing the full 1200 mg/ day as this can lead to increased risk of cardiovascular disease. I recommend Calcium Citrate over the counter, and you may take a total of 600 to 800 mg per day in divided doses with meals for best absorption.   For bone health, you need adequate vitamin D, and I recommend you supplement as it is harder to do so with diet alone. I recommend cholecalciferol 800 units daily.  Also, please ensure you are following a diet high in calcium -- research shows better outcomes with dietary sources including kale, yogurt, broccolii, cheese, okra, almonds- to name a few.     Also remember that exercise is a great medicine for maintain and preserve bone health. Advise moderate exercise for 30 minutes , 3 times per week.   I have ordered CT calcium score ( SELF PAY option)  to further stratify your overall cardiovascular risk .   An estimate of cost is $150-200 out-of-pocket as not covered by insurance. However please call your insurance company in advance to ensure no other costs so that you do not have any unexpected bills.     In [facility], It can be performed at any of these 3  locations (outpatient  imaging center Administrator, sports) at [facility] [address]/ [employer], [facility] or [place] outpatient center)   I have placed your order to [facility] in [place] as  generally most convenient.   Phone Number to [facility] on [address] is is [phone] to get scheduled.   Please call to get scheduled and if any issues at all in doing so, please let me know.   Below an article from [facility] regarding the test.   [email or web address]~:[email or web address]%[email or web address]%[email or web address],arteries%[email or web address]%[email or web address].   Exams We Offer: Cardiac CT Calcium Score  Knowing your score could save your life. A cardiac CT calcium score, also known as a coronary calcium scan, is a quick, convenient and noninvasive way of evaluating the amount of calcified (hard) plaque in your heart vessels. The level of calcium equates to the extent of plaque build-up in your arteries. Plaque in the arteries can cause heart attacks.  The radiologist reads the images and sends your doctor a report with a calcium score. Patients with higher scores have a greater risk for a heart attack, heart disease or stroke. Knowing your score can help your doctor decide on blood pressure and cholesterol goals that will minimize your risk as much as possible.  The [employer] of Cardiology found that Coronary artery calcification (CAC) is an excellent cardiovascular disease risk marker and can help guide the decision to use cholesterol reducing medications such as statins. A negative calcium score may reduce the need for statins in otherwise eligible patients.  The exam takes less than 10 minutes, is painless and does not require any IV or oral contrast. At [facility] locations, the out-of-pocket fee without insurance is $75. At the time of scheduling, please let us know if you want to process the exam through your  insurance or self-pay at the rate of $75. Patients who want to self-pay should not submit their insurance card when checking in for the appointment.   Who should get a Cardiac CT Calcium Score: Middle age adults at intermediate risk of heart disease Family history of heart disease Borderline high cholesterol, high blood pressure or diabetes Overweight or physical inactivity Uncertain about taking daily preventive medical therapy

## 2023-02-26 NOTE — Telephone Encounter (Signed)
Lft pt vm to call ofc to sch to Ct. thanks

## 2023-02-26 NOTE — Assessment & Plan Note (Signed)
Fortunately patient is asymptomatic. Patient will resume pravastatin 10 mg daily.  She was intolerant to Crestor.  Consider changing to Lipitor.  Pending CT calcium score for further restratification.  If score is above 0, we discussed referral to cardiology for further evaluation based on family history.

## 2023-02-26 NOTE — Progress Notes (Signed)
Assessment & Plan:  History of vitamin D deficiency -     VITAMIN D 25 Hydroxy (Vit-D Deficiency, Fractures)  Atherosclerosis of aorta (HCC) Assessment & Plan: Fortunately patient is asymptomatic. Patient will resume pravastatin 10 mg daily.  She was intolerant to Crestor.  Consider changing to Lipitor.  Pending CT calcium score for further restratification.  If score is above 0, we discussed referral to cardiology for further evaluation based on family history.    Orders: -     CT CARDIAC SCORING (SELF PAY ONLY); Future  Encounter for smoking cessation counseling Assessment & Plan: Congratulated patient on smoking cessation.  Discussed weight gain and discussed creating caloric deficit, following a low glycemic diet      Return precautions given.   Risks, benefits, and alternatives of the medications and treatment plan prescribed today were discussed, and patient expressed understanding.   Education regarding symptom management and diagnosis given to patient on AVS either electronically or printed.  Return for Complete Physical Exam.  Rennie Plowman, FNP  Subjective:    Patient ID: Heather Pope, female    DOB: 1966/06/25, 57 y.o.   MRN: [id number]  CC: Heather Pope is a 57 y.o. female who presents today for follow up.   HPI: Overall feels well today.  She stopped smoking 2 months ago by using nicotine patches.  She endorses increased appetite  Strong family history of heart disease.  History of atherosclerosis.  She is stopped taking pravastatin incidentally but plans to resume.  Denies chest pain, shortness of breath, palpitations   Mammogram and CT chest lung cancer screening as scheduled.  Allergies: Ibuprofen, Levaquin [levofloxacin in d5w], Oxycodone, and Penicillins Current Outpatient Medications on File Prior to Visit  Medication Sig Dispense Refill   acetaminophen (TYLENOL) 500 MG tablet Take 500 mg by mouth every 6 (six) hours as needed.  2 tablets three times daily      ALPRAZolam (XANAX) 0.5 MG tablet TAKE 1 TABLET(0.5 MG) BY MOUTH DAILY AS NEEDED FOR ANXIETY 30 tablet 0   cyclobenzaprine (FLEXERIL) 5 MG tablet Take 1 tablet (5 mg total) by mouth daily as needed for muscle spasms. 30 tablet 2   famotidine (PEPCID AC) 10 MG tablet Take 1 tablet (10 mg total) by mouth 2 (two) times daily. 60 tablet 2   ondansetron (ZOFRAN-ODT) 4 MG disintegrating tablet Take 1 tablet (4 mg total) by mouth every 8 (eight) hours as needed for nausea or vomiting. 30 tablet 1   pravastatin (PRAVACHOL) 10 MG tablet Take 1 tablet (10 mg total) by mouth daily. 90 tablet 3   No current facility-administered medications on file prior to visit.    Review of Systems  Constitutional:  Negative for chills and fever.  Respiratory:  Negative for cough and shortness of breath.   Cardiovascular:  Negative for chest pain and palpitations.  Gastrointestinal:  Negative for nausea and vomiting.      Objective:    BP 128/82   Pulse 79   Temp 97.9 F (36.6 C) (Oral)   Ht 5\' 8"  (1.727 m)   Wt 222 lb (100.7 kg)   SpO2 98%   BMI 33.75 kg/m  BP Readings from Last 3 Encounters:  02/26/23 128/82  12/18/22 127/79  08/14/22 130/70   Wt Readings from Last 3 Encounters:  02/26/23 222 lb (100.7 kg)  08/14/22 211 lb 6.4 oz (95.9 kg)  07/03/22 208 lb 9.6 oz (94.6 kg)    Physical Exam Vitals reviewed.  Constitutional:  Appearance: She is well-developed.  Eyes:     Conjunctiva/sclera: Conjunctivae normal.  Cardiovascular:     Rate and Rhythm: Normal rate and regular rhythm.     Pulses: Normal pulses.     Heart sounds: Normal heart sounds.  Pulmonary:     Effort: Pulmonary effort is normal.     Breath sounds: Normal breath sounds. No wheezing, rhonchi or rales.  Skin:    General: Skin is warm and dry.  Neurological:     Mental Status: She is alert.  Psychiatric:        Speech: Speech normal.        Behavior: Behavior normal.        Thought  Content: Thought content normal.

## 2023-02-26 NOTE — Assessment & Plan Note (Signed)
Congratulated patient on smoking cessation.  Discussed weight gain and discussed creating caloric deficit, following a low glycemic diet

## 2023-03-01 ENCOUNTER — Ambulatory Visit
Admission: RE | Admit: 2023-03-01 | Discharge: 2023-03-01 | Disposition: A | Discharge status: Home / Self Care | Payer: BC Managed Care – PPO | Source: Ambulatory Visit | Attending: Family | Admitting: Family

## 2023-03-01 ENCOUNTER — Other Ambulatory Visit: Payer: Self-pay | Admitting: Family

## 2023-03-01 ENCOUNTER — Encounter: Payer: Self-pay | Admitting: Family

## 2023-03-01 DIAGNOSIS — F17210 Nicotine dependence, cigarettes, uncomplicated: Secondary | ICD-10-CM | POA: Diagnosis not present

## 2023-03-01 DIAGNOSIS — Z87891 Personal history of nicotine dependence: Secondary | ICD-10-CM | POA: Insufficient documentation

## 2023-03-01 DIAGNOSIS — Z122 Encounter for screening for malignant neoplasm of respiratory organs: Secondary | ICD-10-CM | POA: Diagnosis not present

## 2023-03-01 DIAGNOSIS — Z8639 Personal history of other endocrine, nutritional and metabolic disease: Secondary | ICD-10-CM

## 2023-03-01 MED ORDER — CHOLECALCIFEROL 1.25 MG (50000 UT) PO TABS: ORAL_TABLET | ORAL | 0 refills | Status: DC

## 2023-03-07 ENCOUNTER — Ambulatory Visit
Admission: RE | Admit: 2023-03-07 | Discharge: 2023-03-07 | Disposition: A | Discharge status: Home / Self Care | Payer: BC Managed Care – PPO | Source: Ambulatory Visit | Attending: Family | Admitting: Family

## 2023-03-07 DIAGNOSIS — Z1231 Encounter for screening mammogram for malignant neoplasm of breast: Secondary | ICD-10-CM | POA: Diagnosis not present

## 2023-03-08 ENCOUNTER — Other Ambulatory Visit: Payer: Self-pay | Admitting: Acute Care

## 2023-03-08 DIAGNOSIS — Z122 Encounter for screening for malignant neoplasm of respiratory organs: Secondary | ICD-10-CM

## 2023-03-08 DIAGNOSIS — Z87891 Personal history of nicotine dependence: Secondary | ICD-10-CM

## 2023-03-11 ENCOUNTER — Ambulatory Visit
Admission: RE | Admit: 2023-03-11 | Discharge: 2023-03-11 | Disposition: A | Discharge status: Home / Self Care | Payer: BC Managed Care – PPO | Source: Ambulatory Visit | Attending: Family | Admitting: Family

## 2023-03-11 ENCOUNTER — Encounter: Payer: Self-pay | Admitting: Family

## 2023-03-11 DIAGNOSIS — I700 Atherosclerosis of aorta: Secondary | ICD-10-CM | POA: Insufficient documentation

## 2023-05-08 ENCOUNTER — Other Ambulatory Visit: Payer: Self-pay | Admitting: Family

## 2023-05-08 DIAGNOSIS — F419 Anxiety disorder, unspecified: Secondary | ICD-10-CM

## 2023-05-29 ENCOUNTER — Other Ambulatory Visit: Payer: Self-pay | Admitting: Podiatry

## 2023-05-29 DIAGNOSIS — M7672 Peroneal tendinitis, left leg: Secondary | ICD-10-CM

## 2023-07-16 ENCOUNTER — Ambulatory Visit (INDEPENDENT_AMBULATORY_CARE_PROVIDER_SITE_OTHER): Payer: BC Managed Care – PPO

## 2023-07-16 DIAGNOSIS — Z23 Encounter for immunization: Secondary | ICD-10-CM

## 2023-07-16 NOTE — Progress Notes (Signed)
Pt came in to office for regular flu vaccine. It was given in her right deltoid pt tolerated well

## 2023-07-21 ENCOUNTER — Encounter: Payer: Self-pay | Admitting: Emergency Medicine

## 2023-07-21 ENCOUNTER — Ambulatory Visit
Admission: EM | Admit: 2023-07-21 | Discharge: 2023-07-21 | Disposition: A | Discharge status: Home / Self Care | Payer: BC Managed Care – PPO | Attending: Emergency Medicine | Admitting: Emergency Medicine

## 2023-07-21 DIAGNOSIS — J0100 Acute maxillary sinusitis, unspecified: Secondary | ICD-10-CM | POA: Diagnosis not present

## 2023-07-21 DIAGNOSIS — H6501 Acute serous otitis media, right ear: Secondary | ICD-10-CM | POA: Diagnosis not present

## 2023-07-21 MED ORDER — FLUTICASONE PROPIONATE 50 MCG/ACT NA SUSP: 2.0000 | Freq: Every day | NASAL | 0 refills | Status: AC

## 2023-07-21 MED ORDER — PREDNISONE 20 MG PO TABS: 40.0000 mg | ORAL_TABLET | Freq: Every day | ORAL | 0 refills | Status: AC

## 2023-07-21 MED ORDER — DOXYCYCLINE HYCLATE 100 MG PO CAPS: 100.0000 mg | ORAL_CAPSULE | Freq: Two times a day (BID) | ORAL | 0 refills | Status: AC

## 2023-07-21 NOTE — ED Provider Notes (Signed)
HPI  SUBJECTIVE:  Heather Pope is a 57 y.o. female who presents with 2 days of dull, sharp, intermittent, hours long pain and pressure in her right ear accompanied with nasal congestion, right-sided headache.  She reports tinnitus.  denies sinus pain and pressure, popping when she opens up her jaw, chews/ yawns, fevers, rhinorrhea, dental pain, change in hearing, otorrhea, facial rash.  No sore throat, recent foreign body insertion, recent swimming, trauma to the ear.  She reports facial pain along the temple and an episode of vertigo yesterday with standing up that resolved spontaneously.  No allergy symptoms.  She does not grind her teeth at night.  No antibiotics in the past month.  She took Tylenol within 6 hours of evaluation.  She has also tried Alka-Seltzer allergy with improvement in her symptoms.  No aggravating factors.  She has a past medical history of vertigo, hypercholesterolemia, COPD, questionable history of varicella.  She has not had shingles, and has gotten the shingles vaccine.  No history of diabetes, hypertension, Mnire's, frequent otitis media, TMJ arthralgia.  PCP: [facility].  She cannot take NSAIDs due to history of GI bleed and has anaphylaxis to penicillins.    Past Medical History:  Diagnosis Date   Arthritis    lower back   Endometriosis    GERD (gastroesophageal reflux disease)    PONV (postoperative nausea and vomiting)    Renal cyst     Past Surgical History:  Procedure Laterality Date   ABDOMINAL HYSTERECTOMY     for endometriosis; Dr. Haskel Khan, one ovary in place ( unsure which side), NO cervix per patient. No h/o abnormal pap smear   BREAST SURGERY     Dr. Orson Aloe, normal   COLONOSCOPY WITH PROPOFOL N/A 03/25/2020   Procedure: COLONOSCOPY WITH PROPOFOL;  Surgeon: Midge Minium, MD;  Location: [facility];  Service: Endoscopy;  Laterality: N/A;  priority 4   NASAL SEPTOPLASTY W/ TURBINOPLASTY Bilateral 07/23/2018    Procedure: NASAL SEPTOPLASTY WITH TURBINATE REDUCTION;  Surgeon: Bud Face, MD;  Location: [facility];  Service: ENT;  Laterality: Bilateral;   POLYPECTOMY  03/25/2020   Procedure: POLYPECTOMY;  Surgeon: Midge Minium, MD;  Location: [facility];  Service: Endoscopy;;   SPINAL FUSION     SPINE SURGERY  2011   L4-5, S1 fusion, Dr. Gerrit Heck   VAGINAL DELIVERY     3    Family History  Problem Relation Age of Onset   Heart disease Mother        she is unsure of age   Diabetes Mother    Heart disease Maternal Grandmother    Heart disease Maternal Grandfather        CABG   Heart disease Paternal Grandfather    Multiple sclerosis Daughter    Cancer Maternal Aunt        brain   Breast cancer Neg Hx     Social History   Tobacco Use   Smoking status: Former    Current packs/day: 0.00    Average packs/day: 0.4 packs/day for 44.0 years (17.6 ttl pk-yrs)    Types: Cigarettes    Start date: 01/07/1979    Quit date: 01/07/2023    Years since quitting: 0.5   Smokeless tobacco: Never  Vaping Use   Vaping status: Never Used  Substance Use Topics   Alcohol use: No   Drug use: No    No current facility-administered medications for this encounter.  Current Outpatient Medications:    doxycycline (  VIBRAMYCIN) 100 MG capsule, Take 1 capsule (100 mg total) by mouth 2 (two) times daily for 10 days., Disp: 20 capsule, Rfl: 0   fluticasone (FLONASE) 50 MCG/ACT nasal spray, Place 2 sprays into both nostrils daily., Disp: 16 g, Rfl: 0   pravastatin (PRAVACHOL) 10 MG tablet, Take 1 tablet (10 mg total) by mouth daily., Disp: 90 tablet, Rfl: 3   predniSONE (DELTASONE) 20 MG tablet, Take 2 tablets (40 mg total) by mouth daily with breakfast for 5 days., Disp: 10 tablet, Rfl: 0   acetaminophen (TYLENOL) 500 MG tablet, Take 500 mg by mouth every 6 (six) hours as needed. 2 tablets three times daily , Disp: , Rfl:    ALPRAZolam (XANAX) 0.5 MG tablet, TAKE 1 TABLET(0.5 MG) BY MOUTH  DAILY AS NEEDED FOR ANXIETY, Disp: 30 tablet, Rfl: 2   Cholecalciferol 1.25 MG (50000 UT) TABS, 50,000 units PO qwk for 12 weeks., Disp: 12 tablet, Rfl: 0   cyclobenzaprine (FLEXERIL) 5 MG tablet, Take 1 tablet (5 mg total) by mouth daily as needed for muscle spasms., Disp: 30 tablet, Rfl: 2   famotidine (PEPCID AC) 10 MG tablet, Take 1 tablet (10 mg total) by mouth 2 (two) times daily., Disp: 60 tablet, Rfl: 2   ondansetron (ZOFRAN-ODT) 4 MG disintegrating tablet, Take 1 tablet (4 mg total) by mouth every 8 (eight) hours as needed for nausea or vomiting., Disp: 30 tablet, Rfl: 1  Allergies  Allergen Reactions   Ibuprofen    Levaquin [Levofloxacin In D5w]     Knee problems   Oxycodone     All narcotics makes blood pressure drop low   Penicillins      ROS  As noted in HPI.   Physical Exam  BP (!) 132/91 (BP Location: Right Arm)   Pulse 68   Temp 97.8 F (36.6 C) (Oral)   Resp 14   Ht 5\' 8"  (1.727 m)   Wt 98.9 kg   SpO2 98%   BMI 33.15 kg/m   Constitutional: Well developed, well nourished, no acute distress Eyes:  EOMI, conjunctiva normal bilaterally HENT: Normocephalic, atraumatic,mucus membranes moist.  Right ear: Decreased hearing compared to left.  Pain with traction on pinna, palpation of tragus.  No tenderness over the mastoid.  EAC normal.  TM intact with air-fluid levels behind it.  No erythema, dullness or bulging.  Left external ear, EAC, TM normal.  TMJ nontender bilaterally.  No crepitus.  Mild nasal congestion.  Positive right maxillary sinus tenderness.  No other sinus tenderness.  Normal dentition.  Normal oropharynx without postnasal drip. Neck: No cervical lymphadenopathy. Respiratory: Normal inspiratory effort Cardiovascular: Normal rate GI: nondistended skin: No facial rash or tenderness in the right V1 dermatome, skin intact Musculoskeletal: no deformities Neurologic: Alert & oriented x 3, no focal neuro deficits Psychiatric: Speech and behavior  appropriate   ED Course   Medications - No data to display  No orders of the defined types were placed in this encounter.   No results found for this or any previous visit (from the past 24 hour(s)). No results found.  ED Clinical Impression  1. Non-recurrent acute serous otitis media of right ear   2. Acute non-recurrent maxillary sinusitis      ED Assessment/Plan     Patient presents with serous otitis and possibly early right maxillary sinusitis.  Shingles in the differential, but there is no evidence of that today.  Will send home with prednisone 40 mg for 5 days, Mucinex D, Flonase,  Tylenol 1000 mg 3-4 times a day as needed for pain.  Wait-and-see prescription of doxycycline for 10 days for sinusitis/serous otitis if not better in a few days.   patient reports anaphylaxis with penicillins.  No NSAIDs because of history of GI bleed.  Follow-up with PCP as needed.  Discussed that she may need to be referred to ENT.  Discussed  MDM, treatment plan, and plan for follow-up with patient. Discussed sn/sx that should prompt return to the ED. patient agrees with plan.   Meds ordered this encounter  Medications   fluticasone (FLONASE) 50 MCG/ACT nasal spray    Sig: Place 2 sprays into both nostrils daily.    Dispense:  16 g    Refill:  0   predniSONE (DELTASONE) 20 MG tablet    Sig: Take 2 tablets (40 mg total) by mouth daily with breakfast for 5 days.    Dispense:  10 tablet    Refill:  0   doxycycline (VIBRAMYCIN) 100 MG capsule    Sig: Take 1 capsule (100 mg total) by mouth 2 (two) times daily for 10 days.    Dispense:  20 capsule    Refill:  0      *This clinic note was created using Scientist, clinical (histocompatibility and immunogenetics). Therefore, there may be occasional mistakes despite careful proofreading.  ?    Domenick Gong, MD 07/24/23 [id number]

## 2023-07-21 NOTE — ED Triage Notes (Signed)
Patient c/o right ear pain for a week. Patient denies fevers.

## 2023-07-21 NOTE — Discharge Instructions (Addendum)
Mucinex D, Flonase, prednisone Tylenol 1000 mg 3-4 times a day as needed for pain.  The prednisone will help with the pain and swelling in her sinuses and ears.  If you are not getting better in several days with this regimen, go ahead and start the doxycycline.  This will take care of an ear and sinus infection.  Follow-up with your primary care provider if you are not better after finishing the doxycycline.  If a rash shows up on your face, then you have shingles.  Seek medical attention.  We will need to change your medications.

## 2023-08-12 ENCOUNTER — Other Ambulatory Visit: Payer: Self-pay | Admitting: Family

## 2023-08-12 ENCOUNTER — Telehealth: Payer: Self-pay | Admitting: Family

## 2023-08-12 DIAGNOSIS — Z87898 Personal history of other specified conditions: Secondary | ICD-10-CM

## 2023-08-12 DIAGNOSIS — Z13220 Encounter for screening for lipoid disorders: Secondary | ICD-10-CM

## 2023-08-12 DIAGNOSIS — Z8639 Personal history of other endocrine, nutritional and metabolic disease: Secondary | ICD-10-CM

## 2023-08-12 NOTE — Telephone Encounter (Signed)
Patient need lab orders.

## 2023-08-14 ENCOUNTER — Other Ambulatory Visit (INDEPENDENT_AMBULATORY_CARE_PROVIDER_SITE_OTHER): Payer: BC Managed Care – PPO

## 2023-08-14 ENCOUNTER — Encounter: Payer: Self-pay | Admitting: Family

## 2023-08-14 ENCOUNTER — Other Ambulatory Visit: Payer: Self-pay | Admitting: Family

## 2023-08-14 DIAGNOSIS — Z8639 Personal history of other endocrine, nutritional and metabolic disease: Secondary | ICD-10-CM | POA: Diagnosis not present

## 2023-08-14 DIAGNOSIS — Z13220 Encounter for screening for lipoid disorders: Secondary | ICD-10-CM

## 2023-08-14 DIAGNOSIS — R899 Unspecified abnormal finding in specimens from other organs, systems and tissues: Secondary | ICD-10-CM

## 2023-08-14 DIAGNOSIS — Z136 Encounter for screening for cardiovascular disorders: Secondary | ICD-10-CM

## 2023-08-14 DIAGNOSIS — Z87898 Personal history of other specified conditions: Secondary | ICD-10-CM | POA: Diagnosis not present

## 2023-08-14 LAB — TSH: TSH: 7.89 u[IU]/mL — ABNORMAL HIGH (ref 0.35–5.50)

## 2023-08-14 LAB — COMPREHENSIVE METABOLIC PANEL
ALT: 12 U/L (ref 0–35)
AST: 14 U/L (ref 0–37)
Albumin: 4.3 g/dL (ref 3.5–5.2)
Alkaline Phosphatase: 60 U/L (ref 39–117)
BUN: 11 mg/dL (ref 6–23)
CO2: 28 meq/L (ref 19–32)
Calcium: 9.1 mg/dL (ref 8.4–10.5)
Chloride: 104 meq/L (ref 96–112)
Creatinine, Ser: 0.79 mg/dL (ref 0.40–1.20)
GFR: 82.04 mL/min (ref >60.00)
Glucose, Bld: 88 mg/dL (ref 70–99)
Potassium: 4.2 meq/L (ref 3.5–5.1)
Sodium: 138 meq/L (ref 135–145)
Total Bilirubin: 0.5 mg/dL (ref 0.2–1.2)
Total Protein: 7 g/dL (ref 6.0–8.3)

## 2023-08-14 LAB — CBC WITH DIFFERENTIAL/PLATELET
Basophils Absolute: 0.1 10*3/uL (ref 0.0–0.1)
Basophils Relative: 0.9 % (ref 0.0–3.0)
Eosinophils Absolute: 0.2 10*3/uL (ref 0.0–0.7)
Eosinophils Relative: 3.3 % (ref 0.0–5.0)
HCT: 43.6 % (ref 36.0–46.0)
Hemoglobin: 14.1 g/dL (ref 12.0–15.0)
Lymphocytes Relative: 30.6 % (ref 12.0–46.0)
Lymphs Abs: 2 10*3/uL (ref 0.7–4.0)
MCHC: 32.3 g/dL (ref 30.0–36.0)
MCV: 88.3 fL (ref 78.0–100.0)
Monocytes Absolute: 0.4 10*3/uL (ref 0.1–1.0)
Monocytes Relative: 6.7 % (ref 3.0–12.0)
Neutro Abs: 3.7 10*3/uL (ref 1.4–7.7)
Neutrophils Relative %: 58.5 % (ref 43.0–77.0)
Platelets: 268 10*3/uL (ref 150.0–400.0)
RBC: 4.94 Mil/uL (ref 3.87–5.11)
RDW: 13.6 % (ref 11.5–15.5)
WBC: 6.4 10*3/uL (ref 4.0–10.5)

## 2023-08-14 LAB — HEMOGLOBIN A1C: Hgb A1c MFr Bld: 5.5 % (ref 4.6–6.5)

## 2023-08-14 LAB — LIPID PANEL
Cholesterol: 175 mg/dL (ref 0–200)
HDL: 71.2 mg/dL (ref >39.00)
LDL Cholesterol: 88 mg/dL (ref 0–99)
NonHDL: 103.7
Total CHOL/HDL Ratio: 2
Triglycerides: 81 mg/dL (ref 0.0–149.0)
VLDL: 16.2 mg/dL (ref 0.0–40.0)

## 2023-08-14 LAB — VITAMIN D 25 HYDROXY (VIT D DEFICIENCY, FRACTURES): VITD: 12.35 ng/mL — ABNORMAL LOW (ref 30.00–100.00)

## 2023-08-14 MED ORDER — CHOLECALCIFEROL 1.25 MG (50000 UT) PO TABS
ORAL_TABLET | ORAL | 0 refills | Status: DC
Start: 2023-08-14 — End: 2024-08-21

## 2023-08-14 MED ORDER — CHOLECALCIFEROL 1.25 MG (50000 UT) PO TABS
ORAL_TABLET | ORAL | 0 refills | Status: DC
Start: 2023-08-14 — End: 2023-08-14

## 2023-08-19 ENCOUNTER — Encounter: Payer: Self-pay | Admitting: Family

## 2023-08-19 ENCOUNTER — Ambulatory Visit (INDEPENDENT_AMBULATORY_CARE_PROVIDER_SITE_OTHER): Payer: BC Managed Care – PPO | Admitting: Family

## 2023-08-19 VITALS — BP 128/82 | HR 60 | Temp 97.7°F | Ht 68.5 in | Wt 221.8 lb

## 2023-08-19 DIAGNOSIS — Z8639 Personal history of other endocrine, nutritional and metabolic disease: Secondary | ICD-10-CM

## 2023-08-19 DIAGNOSIS — R899 Unspecified abnormal finding in specimens from other organs, systems and tissues: Secondary | ICD-10-CM | POA: Diagnosis not present

## 2023-08-19 DIAGNOSIS — Z0000 Encounter for general adult medical examination without abnormal findings: Secondary | ICD-10-CM | POA: Diagnosis not present

## 2023-08-19 LAB — T4, FREE: Free T4: 0.78 ng/dL (ref 0.60–1.60)

## 2023-08-19 LAB — TSH: TSH: 6.75 u[IU]/mL — ABNORMAL HIGH (ref 0.35–5.50)

## 2023-08-19 LAB — T3, FREE: T3, Free: 3.3 pg/mL (ref 2.3–4.2)

## 2023-08-19 NOTE — Patient Instructions (Addendum)
After vitamin D prescription strength, start vitamin d 800 to 1000 international units  daily.    Please download Myfitness Pal App ( basic version is free).   You may log every thing you eat for even 2-3 days to get a better of idea of total daily calories. To loose weight, we have to create caloric deficit to loose weight. The goal is 1-2 lbs per week of weight loss.   Excellent article below from [facility].   https://www.health.CriticalZ.it  Calorie counting made easy  Eat less, exercise more. If only it were that simple! As most dieters know, losing weight can be very challenging. As this report details, a range of influences can affect how people gain and lose weight. But a basic understanding of how to tip your energy balance in favor of weight loss is a good place to start.  Start by determining how many calories you should consume each day. To do so, you need to know how many calories you need to maintain your current weight. Doing this requires a few simple calculations.  First, multiply your current weight by 15 -- that's roughly the number of calories per pound of body weight needed to maintain your current weight if you are moderately active. Moderately active means getting at least 30 minutes of physical activity a day in the form of exercise (walking at a brisk pace, climbing stairs, or active gardening). Let's say you're a woman who is 5 feet, 4 inches tall and weighs 155 pounds, and you need to lose about 15 pounds to put you in a healthy weight range. If you multiply 155 by 15, you will get 2,325, which is the number of calories per day that you need in order to maintain your current weight (weight-maintenance calories). To lose weight, you will need to get below that total.  For example, to lose 1 to 2 pounds a week -- a rate that experts consider safe -- your food consumption should provide 500 to 1,000 calories less than your total  weight-maintenance calories. If you need 2,325 calories a day to maintain your current weight, reduce your daily calories to between 1,325 and 1,825. If you are sedentary, you will also need to build more activity into your day. In order to lose at least a pound a week, try to do at least 30 minutes of physical activity on most days, and reduce your daily calorie intake by at least 500 calories. However, calorie intake should not fall below 1,200 a day in women or 1,500 a day in men, except under the supervision of a health professional. Eating too few calories can endanger your health by depriving you of needed nutrients.  Meeting your calorie target How can you meet your daily calorie target? One approach is to add up the number of calories per serving of all the foods that you eat, and then plan your menus accordingly. You can buy books that list calories per serving for many foods. In addition, the nutrition labels on all packaged foods and beverages provide calories per serving information. Make a point of reading the labels of the foods and drinks you use, noting the number of calories and the serving sizes. Many recipes published in cookbooks, newspapers, and magazines provide similar information.  If you hate counting calories, a different approach is to restrict how much and how often you eat, and to eat meals that are low in calories. Dietary guidelines issued by the American Heart Association stress common sense  in choosing your foods rather than focusing strictly on numbers, such as total calories or calories from fat. Whichever method you choose, research shows that a regular eating schedule -- with meals and snacks planned for certain times each day -- makes for the most successful approach. The same applies after you have lost weight and want to keep it off. Sticking with an eating schedule increases your chance of maintaining your new weight.    This is  Dr. Melina Schools  ( an amazing physician  in my office!)  example of a  "Low GI"  Diet:  It will allow you to lose 4 to 8  lbs  per month if you follow it carefully.  Your goal with exercise is a minimum of 30 minutes of aerobic exercise 5 days per week (Walking does not count once it becomes easy!)    All of the foods can be found at grocery stores and in bulk at [employer].  The Atkins protein bars and shakes are available in more varieties at Target, [employer] and [employer]'s Foods.     7 AM Breakfast:  Choose from the following:  Low carbohydrate Protein  Shakes (I recommend the  Premier Protein chocolate shakes,  EAS AdvantEdge "Carb Control" shakes  Or the Atkins shakes all are under 3 net carbs)     a scrambled egg/bacon/cheese burrito made with Mission's "carb balance" whole wheat tortilla  (about 10 net carbs )  Medical laboratory scientific officer (basically a quiche without the pastry crust) that is eaten cold and very convenient way to get your eggs.  8 carbs)  If you make your own protein shakes, avoid bananas and pineapple,  And use low carb greek yogurt or original /unsweetened almond or soy milk    Avoid cereal and bananas, oatmeal and cream of wheat and grits. They are loaded with carbohydrates!   10 AM: high protein snack:  Protein bar by Atkins (the snack size, under 200 cal, usually < 6 net carbs).    A stick of cheese:  Around 1 carb,  100 cal     Dannon Light n Fit [place] Yogurt  (80 cal, 8 carbs)  Other so called "protein bars" and Greek yogurts tend to be loaded with carbohydrates.  Remember, in food advertising, the word "energy" is synonymous for " carbohydrate."  Lunch:   A Sandwich using the bread choices listed, Can use any  Eggs,  lunchmeat, grilled meat or canned tuna), avocado, regular mayo/mustard  and cheese.  A Salad using blue cheese, ranch,  Goddess or vinagrette,  Avoid taco shells, croutons or "confetti" and no "candied nuts" but regular nuts OK.   No pretzels, nabs  or chips.  Pickles and miniature  sweet peppers are a good low carb alternative that provide a "crunch"  The bread is the only source of carbohydrate in a sandwich and  can be decreased by trying some of the attached alternatives to traditional loaf bread   Avoid "Low fat dressings, as well as Reyne Dumas and [employer] dressings They are loaded with sugar!   3 PM/ Mid day  Snack:  Consider  1 ounce of  almonds, walnuts, pistachios, pecans, peanuts,  Macadamia nuts or a nut medley.  Avoid "granola and granola bars "  Mixed nuts are ok in moderation as long as there are no raisins,  cranberries or dried fruit.   KIND bars are OK if you get the low glycemic index variety   Try the  prosciutto/mozzarella cheese sticks by Fiorruci  In deli /backery section   High protein      6 PM  Dinner:     Meat/fowl/fish with a green salad, and either broccoli, cauliflower, green beans, spinach, brussel sprouts or  Lima beans. DO NOT BREAD THE PROTEIN!!      There is a low carb pasta by Dreamfield's that is acceptable and tastes great: only 5 digestible carbs/serving.( All grocery stores but BJs carry it ) Several ready made meals are available low carb:   Try Michel Angelo's chicken piccata or chicken or eggplant parm over low carb pasta.(Lowes and BJs)   Clifton Custard Sanchez's "Carnitas" (pulled pork, no sauce,  0 carbs) or his beef pot roast to make a dinner burrito (at BJ's)  Pesto over low carb pasta (bj's sells a good quality pesto in the center refrigerated section of the deli   Try satueeing  Roosvelt Harps with mushroooms as a good side   [employer] Giant makes a mashed cauliflower that tastes like mashed potatoes  Whole wheat pasta is still full of digestible carbs and  Not as low in glycemic index as Dreamfield's.   Brown rice is still rice,  So skip the rice and noodles if you eat [place] or [place] (or at least limit to 1/2 cup)  9 PM snack :   Breyer's "low carb" fudgsicle or  ice cream bar (Carb Smart line), or  Weight Watcher's ice cream bar  , or another "no sugar added" ice cream;  a serving of fresh berries/cherries with whipped cream   Cheese or DANNON'S LlGHT N FIT GREEK YOGURT  8 ounces of [place] unsweetened almond/cococunut milk    Treat yourself to a parfait made with whipped cream blueberiies, walnuts and vanilla greek yogurt  Avoid bananas, pineapple, grapes  and watermelon on a regular basis because they are high in sugar.  THINK OF THEM AS DESSERT  Remember that snack Substitutions should be less than 10 NET carbs per serving and meals < 20 carbs. Remember to subtract fiber grams to get the "net carbs."  Health Maintenance for Postmenopausal Women Menopause is a normal process in which your ability to get pregnant comes to an end. This process happens slowly over many months or years, usually between the ages of 59 and 53. Menopause is complete when you have missed your menstrual period for 12 months. It is important to talk with your health care provider about some of the most common conditions that affect women after menopause (postmenopausal women). These include heart disease, cancer, and bone loss (osteoporosis). Adopting a healthy lifestyle and getting preventive care can help to promote your health and wellness. The actions you take can also lower your chances of developing some of these common conditions. What are the signs and symptoms of menopause? During menopause, you may have the following symptoms: Hot flashes. These can be moderate or severe. Night sweats. Decrease in sex drive. Mood swings. Headaches. Tiredness (fatigue). Irritability. Memory problems. Problems falling asleep or staying asleep. Talk with your health care provider about treatment options for your symptoms. Do I need hormone replacement therapy? Hormone replacement therapy is effective in treating symptoms that are caused by menopause, such as hot flashes and night sweats. Hormone replacement carries certain risks, especially as  you become older. If you are thinking about using estrogen or estrogen with progestin, discuss the benefits and risks with your health care provider. How can I reduce my risk for heart disease and stroke?  The risk of heart disease, heart attack, and stroke increases as you age. One of the causes may be a change in the body's hormones during menopause. This can affect how your body uses dietary fats, triglycerides, and cholesterol. Heart attack and stroke are medical emergencies. There are many things that you can do to help prevent heart disease and stroke. Watch your blood pressure High blood pressure causes heart disease and increases the risk of stroke. This is more likely to develop in people who have high blood pressure readings or are overweight. Have your blood pressure checked: Every 3-5 years if you are 45-66 years of age. Every year if you are 28 years old or older. Eat a healthy diet  Eat a diet that includes plenty of vegetables, fruits, low-fat dairy products, and lean protein. Do not eat a lot of foods that are high in solid fats, added sugars, or sodium. Get regular exercise Get regular exercise. This is one of the most important things you can do for your health. Most adults should: Try to exercise for at least 150 minutes each week. The exercise should increase your heart rate and make you sweat (moderate-intensity exercise). Try to do strengthening exercises at least twice each week. Do these in addition to the moderate-intensity exercise. Spend less time sitting. Even light physical activity can be beneficial. Other tips Work with your health care provider to achieve or maintain a healthy weight. Do not use any products that contain nicotine or tobacco. These products include cigarettes, chewing tobacco, and vaping devices, such as e-cigarettes. If you need help quitting, ask your health care provider. Know your numbers. Ask your health care provider to check your cholesterol  and your blood sugar (glucose). Continue to have your blood tested as directed by your health care provider. Do I need screening for cancer? Depending on your health history and family history, you may need to have cancer screenings at different stages of your life. This may include screening for: Breast cancer. Cervical cancer. Lung cancer. Colorectal cancer. What is my risk for osteoporosis? After menopause, you may be at increased risk for osteoporosis. Osteoporosis is a condition in which bone destruction happens more quickly than new bone creation. To help prevent osteoporosis or the bone fractures that can happen because of osteoporosis, you may take the following actions: If you are 87-60 years old, get at least 1,000 mg of calcium and at least 600 international units (IU) of vitamin D per day. If you are older than age 51 but younger than age 64, get at least 1,200 mg of calcium and at least 600 international units (IU) of vitamin D per day. If you are older than age 46, get at least 1,200 mg of calcium and at least 800 international units (IU) of vitamin D per day. Smoking and drinking excessive alcohol increase the risk of osteoporosis. Eat foods that are rich in calcium and vitamin D, and do weight-bearing exercises several times each week as directed by your health care provider. How does menopause affect my mental health? Depression may occur at any age, but it is more common as you become older. Common symptoms of depression include: Feeling depressed. Changes in sleep patterns. Changes in appetite or eating patterns. Feeling an overall lack of motivation or enjoyment of activities that you previously enjoyed. Frequent crying spells. Talk with your health care provider if you think that you are experiencing any of these symptoms. General instructions See your health care provider for regular wellness  exams and vaccines. This may include: Scheduling regular health, dental, and eye  exams. Getting and maintaining your vaccines. These include: Influenza vaccine. Get this vaccine each year before the flu season begins. Pneumonia vaccine. Shingles vaccine. Tetanus, diphtheria, and pertussis (Tdap) booster vaccine. Your health care provider may also recommend other immunizations. Tell your health care provider if you have ever been abused or do not feel safe at home. Summary Menopause is a normal process in which your ability to get pregnant comes to an end. This condition causes hot flashes, night sweats, decreased interest in sex, mood swings, headaches, or lack of sleep. Treatment for this condition may include hormone replacement therapy. Take actions to keep yourself healthy, including exercising regularly, eating a healthy diet, watching your weight, and checking your blood pressure and blood sugar levels. Get screened for cancer and depression. Make sure that you are up to date with all your vaccines. This information is not intended to replace advice given to you by your health care provider. Make sure you discuss any questions you have with your health care provider. Document Revised: 02/13/2021 Document Reviewed: 02/13/2021 Elsevier Patient Education  2024 [employer].

## 2023-08-19 NOTE — Assessment & Plan Note (Addendum)
Clinical breast exam performed today.  Deferred pelvic exam in the absent complaints and patient reports again that she does not have a cervix.  Up-to-date on lung cancer screening.  Encouraged continued exercise.  Discussed low glycemic diet, caloric deficit

## 2023-08-19 NOTE — Progress Notes (Signed)
Assessment & Plan:  Routine physical examination Assessment & Plan: Clinical breast exam performed today.  Deferred pelvic exam in the absent complaints and patient reports again that she does not have a cervix.  Up-to-date on lung cancer screening.  Encouraged continued exercise.  Discussed low glycemic diet, caloric deficit   Abnormal laboratory test -     TSH -     T4, free -     T3, free  History of vitamin D deficiency -     Celiac Disease Ab Screen w/Rfx     Return precautions given.   Risks, benefits, and alternatives of the medications and treatment plan prescribed today were discussed, and patient expressed understanding.   Education regarding symptom management and diagnosis given to patient on AVS either electronically or printed.  Return in about 6 months (around 02/16/2024).  Rennie Plowman, FNP  Subjective:    Patient ID: Heather Pope, female    DOB: Oct 18, 1965, 57 y.o.   MRN: [id number]  CC: Heather Pope is a 57 y.o. female who presents today for physical exam.    HPI: Feels well today No new complaints    Colorectal Cancer Screening: UTD , Dr Servando Snare, repeat in 10 years Breast Cancer Screening: Mammogram UTD Cervical Cancer Screening: History of hysterectomy in the setting of endometriosis,  Dr. Haskel Khan, one ovary in place ( unsure which side), No cervix per patient. No h/o abnormal pap smear   Bone Health screening/DEXA for 65+: No increased fracture risk. Defer screening at this time.  Lung Cancer Screening: Up-to-date, obtained 03/01/23         Tetanus - she states up to date as she had last year.  Exercise: Gets regular exercise with walking primarily at work.  Alcohol use:  none Smoking/tobacco use: former smoker.    Health Maintenance  Topic Date Due   DTaP/Tdap/Td vaccine (2 - Td or Tdap) 05/12/2022   COVID-19 Vaccine (4 - 2023-24 season) 06/09/2023   Zoster (Shingles) Vaccine (1 of 2) 11/19/2023*   Mammogram   03/06/2024   Colon Cancer Screening  03/25/2030   Flu Shot  Completed   Hepatitis C Screening  Completed   HIV Screening  Completed   HPV Vaccine  Aged Out  *Topic was postponed. The date shown is not the original due date.    ALLERGIES: Ibuprofen, Levaquin [levofloxacin in d5w], Oxycodone, and Penicillins  Current Outpatient Medications on File Prior to Visit  Medication Sig Dispense Refill   acetaminophen (TYLENOL) 500 MG tablet Take 500 mg by mouth every 6 (six) hours as needed. 2 tablets three times daily      ALPRAZolam (XANAX) 0.5 MG tablet TAKE 1 TABLET(0.5 MG) BY MOUTH DAILY AS NEEDED FOR ANXIETY 30 tablet 2   Cholecalciferol 1.25 MG (50000 UT) TABS 50,000 units PO qwk for 12 weeks. 12 tablet 0   cyclobenzaprine (FLEXERIL) 5 MG tablet Take 1 tablet (5 mg total) by mouth daily as needed for muscle spasms. 30 tablet 2   ondansetron (ZOFRAN-ODT) 4 MG disintegrating tablet Take 1 tablet (4 mg total) by mouth every 8 (eight) hours as needed for nausea or vomiting. 30 tablet 1   famotidine (PEPCID AC) 10 MG tablet Take 1 tablet (10 mg total) by mouth 2 (two) times daily. (Patient not taking: Reported on 08/19/2023) 60 tablet 2   fluticasone (FLONASE) 50 MCG/ACT nasal spray Place 2 sprays into both nostrils daily. (Patient not taking: Reported on 08/19/2023) 16 g 0   pravastatin (PRAVACHOL) 10  MG tablet Take 1 tablet (10 mg total) by mouth daily. 90 tablet 3   No current facility-administered medications on file prior to visit.    Review of Systems  Constitutional:  Negative for chills, fever and unexpected weight change.  HENT:  Negative for congestion.   Respiratory:  Negative for cough.   Cardiovascular:  Negative for chest pain, palpitations and leg swelling.  Gastrointestinal:  Negative for nausea and vomiting.  Musculoskeletal:  Negative for arthralgias and myalgias.  Skin:  Negative for rash.  Neurological:  Negative for headaches.  Hematological:  Negative for adenopathy.   Psychiatric/Behavioral:  Negative for confusion.       Objective:    BP 128/82   Pulse 60   Temp 97.7 F (36.5 C) (Oral)   Ht 5' 8.5" (1.74 m)   Wt 221 lb 12.8 oz (100.6 kg)   SpO2 99%   BMI 33.23 kg/m   BP Readings from Last 3 Encounters:  08/19/23 128/82  07/21/23 (!) 132/91  02/26/23 128/82   Wt Readings from Last 3 Encounters:  08/19/23 221 lb 12.8 oz (100.6 kg)  07/21/23 218 lb (98.9 kg)  02/26/23 222 lb (100.7 kg)    Physical Exam Vitals reviewed.  Constitutional:      Appearance: Normal appearance. She is well-developed.  Eyes:     Conjunctiva/sclera: Conjunctivae normal.  Neck:     Thyroid: No thyroid mass or thyromegaly.  Cardiovascular:     Rate and Rhythm: Normal rate and regular rhythm.     Pulses: Normal pulses.     Heart sounds: Normal heart sounds.  Pulmonary:     Effort: Pulmonary effort is normal.     Breath sounds: Normal breath sounds. No wheezing, rhonchi or rales.  Chest:  Breasts:    Breasts are symmetrical.     Right: No inverted nipple, mass, nipple discharge, skin change or tenderness.     Left: No inverted nipple, mass, nipple discharge, skin change or tenderness.  Abdominal:     General: Bowel sounds are normal. There is no distension.     Palpations: Abdomen is soft. Abdomen is not rigid. There is no fluid wave or mass.     Tenderness: There is no abdominal tenderness. There is no guarding or rebound.  Lymphadenopathy:     Head:     Right side of head: No submental, submandibular, tonsillar, preauricular, posterior auricular or occipital adenopathy.     Left side of head: No submental, submandibular, tonsillar, preauricular, posterior auricular or occipital adenopathy.     Cervical: No cervical adenopathy.     Right cervical: No superficial, deep or posterior cervical adenopathy.    Left cervical: No superficial, deep or posterior cervical adenopathy.  Skin:    General: Skin is warm and dry.  Neurological:     Mental Status:  She is alert.  Psychiatric:        Speech: Speech normal.        Behavior: Behavior normal.        Thought Content: Thought content normal.

## 2023-08-21 LAB — CELIAC DISEASE AB SCREEN W/RFX
Antigliadin Abs, IgA: 4 U (ref 0–19)
IgA/Immunoglobulin A, Serum: 278 mg/dL (ref 87–352)
Transglutaminase IgA: <2 U/mL (ref 0–3)

## 2023-08-27 ENCOUNTER — Other Ambulatory Visit: Payer: Self-pay | Admitting: Family

## 2023-08-27 DIAGNOSIS — R899 Unspecified abnormal finding in specimens from other organs, systems and tissues: Secondary | ICD-10-CM

## 2023-11-28 ENCOUNTER — Telehealth: Payer: Self-pay | Admitting: Family

## 2023-11-28 ENCOUNTER — Other Ambulatory Visit: Payer: BC Managed Care – PPO

## 2023-11-28 NOTE — Telephone Encounter (Signed)
 Patient need lab orders.

## 2023-11-29 ENCOUNTER — Other Ambulatory Visit: Payer: Self-pay | Admitting: Family

## 2023-11-29 DIAGNOSIS — R899 Unspecified abnormal finding in specimens from other organs, systems and tissues: Secondary | ICD-10-CM

## 2023-12-03 ENCOUNTER — Other Ambulatory Visit (INDEPENDENT_AMBULATORY_CARE_PROVIDER_SITE_OTHER): Payer: BC Managed Care – PPO

## 2023-12-03 DIAGNOSIS — R899 Unspecified abnormal finding in specimens from other organs, systems and tissues: Secondary | ICD-10-CM

## 2023-12-04 LAB — TSH: TSH: 6.69 u[IU]/mL — ABNORMAL HIGH (ref 0.35–5.50)

## 2023-12-04 LAB — T3, FREE: T3, Free: 3 pg/mL (ref 2.3–4.2)

## 2023-12-04 LAB — T4, FREE: Free T4: 0.78 ng/dL (ref 0.60–1.60)

## 2023-12-08 ENCOUNTER — Encounter: Payer: Self-pay | Admitting: Family

## 2023-12-09 ENCOUNTER — Telehealth: Payer: Self-pay

## 2023-12-09 NOTE — Addendum Note (Signed)
 Addended by: [place], Cleve Paolillo on: 12/09/2023 04:15 PM   Modules accepted: Orders

## 2023-12-09 NOTE — Telephone Encounter (Signed)
-----   Message from Rennie Plowman sent at 12/08/2023  8:26 AM EST ----- Please order TSH, free T4, free T3 and schedule in 6 months time

## 2023-12-09 NOTE — Telephone Encounter (Signed)
 Pt has seen results labs are ordered but was unable to lvm because vm is full

## 2024-02-17 ENCOUNTER — Ambulatory Visit: Payer: BC Managed Care – PPO | Admitting: Family

## 2024-07-03 ENCOUNTER — Other Ambulatory Visit: Payer: Self-pay | Admitting: Acute Care

## 2024-07-03 DIAGNOSIS — Z122 Encounter for screening for malignant neoplasm of respiratory organs: Secondary | ICD-10-CM

## 2024-07-03 DIAGNOSIS — Z87891 Personal history of nicotine dependence: Secondary | ICD-10-CM

## 2024-07-13 ENCOUNTER — Other Ambulatory Visit: Payer: Self-pay | Admitting: Family

## 2024-07-13 DIAGNOSIS — Z1231 Encounter for screening mammogram for malignant neoplasm of breast: Secondary | ICD-10-CM

## 2024-07-28 ENCOUNTER — Ambulatory Visit
Admission: RE | Admit: 2024-07-28 | Discharge: 2024-07-28 | Disposition: A | Discharge status: Home / Self Care | Source: Ambulatory Visit | Attending: Family | Admitting: Family

## 2024-07-28 DIAGNOSIS — Z1231 Encounter for screening mammogram for malignant neoplasm of breast: Secondary | ICD-10-CM | POA: Diagnosis present

## 2024-08-20 ENCOUNTER — Encounter: Payer: Self-pay | Admitting: Family

## 2024-08-20 ENCOUNTER — Ambulatory Visit: Admitting: Family

## 2024-08-20 VITALS — BP 110/68 | HR 60 | Temp 97.8°F | Ht 68.0 in | Wt 223.4 lb

## 2024-08-20 DIAGNOSIS — Z23 Encounter for immunization: Secondary | ICD-10-CM

## 2024-08-20 DIAGNOSIS — Z13220 Encounter for screening for lipoid disorders: Secondary | ICD-10-CM

## 2024-08-20 DIAGNOSIS — Z136 Encounter for screening for cardiovascular disorders: Secondary | ICD-10-CM | POA: Diagnosis not present

## 2024-08-20 DIAGNOSIS — R899 Unspecified abnormal finding in specimens from other organs, systems and tissues: Secondary | ICD-10-CM

## 2024-08-20 DIAGNOSIS — Z8639 Personal history of other endocrine, nutritional and metabolic disease: Secondary | ICD-10-CM

## 2024-08-20 DIAGNOSIS — Z0000 Encounter for general adult medical examination without abnormal findings: Secondary | ICD-10-CM | POA: Diagnosis not present

## 2024-08-20 DIAGNOSIS — M545 Low back pain, unspecified: Secondary | ICD-10-CM

## 2024-08-20 DIAGNOSIS — F419 Anxiety disorder, unspecified: Secondary | ICD-10-CM

## 2024-08-20 DIAGNOSIS — Z87898 Personal history of other specified conditions: Secondary | ICD-10-CM

## 2024-08-20 DIAGNOSIS — I700 Atherosclerosis of aorta: Secondary | ICD-10-CM

## 2024-08-20 DIAGNOSIS — G8929 Other chronic pain: Secondary | ICD-10-CM

## 2024-08-20 LAB — LIPID PANEL
Cholesterol: 202 mg/dL — ABNORMAL HIGH (ref 0–200)
HDL: 68.2 mg/dL (ref >39.00)
LDL Cholesterol: 107 mg/dL — ABNORMAL HIGH (ref 0–99)
NonHDL: 133.62
Total CHOL/HDL Ratio: 3
Triglycerides: 134 mg/dL (ref 0.0–149.0)
VLDL: 26.8 mg/dL (ref 0.0–40.0)

## 2024-08-20 LAB — TSH: TSH: 4.88 u[IU]/mL (ref 0.35–5.50)

## 2024-08-20 LAB — VITAMIN D 25 HYDROXY (VIT D DEFICIENCY, FRACTURES): VITD: 7.5 ng/mL — ABNORMAL LOW (ref 30.00–100.00)

## 2024-08-20 LAB — HEMOGLOBIN A1C: Hgb A1c MFr Bld: 5.6 % (ref 4.6–6.5)

## 2024-08-20 MED ORDER — ALPRAZOLAM 0.5 MG PO TABS: ORAL_TABLET | ORAL | 0 refills | Status: AC

## 2024-08-20 MED ORDER — CYCLOBENZAPRINE HCL 5 MG PO TABS: 5.0000 mg | ORAL_TABLET | Freq: Every day | ORAL | 2 refills | Status: AC | PRN

## 2024-08-20 NOTE — Progress Notes (Signed)
 Assessment & Plan:  Encounter for lipid screening for cardiovascular disease -     Lipid panel  History of prediabetes -     Hemoglobin A1c  History of vitamin D  deficiency -     VITAMIN D  25 Hydroxy (Vit-D Deficiency, Fractures)  Abnormal laboratory test -     TSH -     T3 -     T4  Anxiety -     ALPRAZolam ; TAKE 1 TABLET(0.5 MG) BY MOUTH DAILY AS NEEDED FOR ANXIETY  Dispense: 30 tablet; Refill: 0  Chronic bilateral low back pain, unspecified whether sciatica present -     Cyclobenzaprine  HCl; Take 1 tablet (5 mg total) by mouth daily as needed for muscle spasms.  Dispense: 30 tablet; Refill: 2  Atherosclerosis of aorta Assessment & Plan: Discussed trial of multiple statins since myalgias.  Also discussed CT calcium  score, of 0.  Discussed her seeing the family history of ASCVD and known aortic atherosclerosis.  she politely declines any further trial of statin or nonstatin therapy at this time   Need for influenza vaccination -     Flu vaccine trivalent PF, 6mos and older(Flulaval,Afluria,Fluarix,Fluzone)  Need for tetanus, diphtheria, and acellular pertussis (Tdap) vaccine -     Tdap vaccine greater than or equal to 7yo IM  Routine physical examination Assessment & Plan: Clinical breast exam performed today.  Influenza and Tdap given.  She will call to schedule CT lung cancer screening and is aware that it is overdue      Return precautions given.   Risks, benefits, and alternatives of the medications and treatment plan prescribed today were discussed, and patient expressed understanding.   Education regarding symptom management and diagnosis given to patient on AVS either electronically or printed.  No follow-ups on file.  Rollene Northern, FNP  Subjective:    Patient ID: Heather Pope, female    DOB: [birth year], 58 y.o.   MRN: [id number]  CC: Heather Pope is a 58 y.o. female who presents today for physical exam.    HPI: Feels well  today.  No new complaints.     Colorectal Cancer Screening: UTD , Dr Jinny, repeat in 10 years Breast Cancer Screening: Mammogram UTD Cervical Cancer Screening: No longer screening for cervical cancer.  History of hysterectomy for endometriosis; Dr. VanDalen, one ovary in place ( unsure which side). She states cervix per patient.  No h/o abnormal pap smear   Bone Health screening/DEXA for 65+: No increased fracture risk. Defer screening at this time.  Lung Cancer Screening: Overdue.  Last 03/01/2023  No family history of AAA.        Tetanus - due         Pneumococcal - Candidate for. Declines today  Exercise: Gets regular exercise.   Alcohol use:  none Smoking/tobacco use: former smoker.    Health Maintenance  Topic Date Due   Pneumococcal Vaccine for age over 51 (1 of 2 - PCV) Never done   Zoster (Shingles) Vaccine (1 of 2) Never done   COVID-19 Vaccine (4 - 2025-26 season) 09/05/2024*   Breast Cancer Screening  07/28/2025   Colon Cancer Screening  03/25/2030   DTaP/Tdap/Td vaccine (3 - Td or Tdap) 08/20/2034   Flu Shot  Completed   Hepatitis C Screening  Completed   HIV Screening  Completed   Hepatitis B Vaccine  Aged Out   HPV Vaccine  Aged Out   Meningitis B Vaccine  Aged Out  *Topic was postponed.  The date shown is not the original due date.    ALLERGIES: Ibuprofen, Levaquin [levofloxacin in d5w], Oxycodone, Penicillins, and Pravastatin   Current Outpatient Medications on File Prior to Visit  Medication Sig Dispense Refill   acetaminophen  (TYLENOL ) 500 MG tablet Take 500 mg by mouth every 6 (six) hours as needed. 2 tablets three times daily      fluticasone  (FLONASE ) 50 MCG/ACT nasal spray Place 2 sprays into both nostrils daily. 16 g 0   ondansetron  (ZOFRAN -ODT) 4 MG disintegrating tablet Take 1 tablet (4 mg total) by mouth every 8 (eight) hours as needed for nausea or vomiting. 30 tablet 1   No current facility-administered medications on file prior to visit.     Review of Systems  Constitutional:  Negative for chills and fever.  Respiratory:  Negative for cough.   Cardiovascular:  Negative for chest pain and palpitations.  Gastrointestinal:  Negative for nausea and vomiting.      Objective:    BP 110/68   Pulse 60   Temp 97.8 F (36.6 C) (Oral)   Ht 5' 8 (1.727 m)   Wt 223 lb 6.4 oz (101.3 kg)   SpO2 97%   BMI 33.97 kg/m   BP Readings from Last 3 Encounters:  08/20/24 110/68  08/19/23 128/82  07/21/23 (!) 132/91   Wt Readings from Last 3 Encounters:  08/20/24 223 lb 6.4 oz (101.3 kg)  08/19/23 221 lb 12.8 oz (100.6 kg)  07/21/23 218 lb (98.9 kg)    Physical Exam Vitals reviewed.  Constitutional:      Appearance: Normal appearance. She is well-developed.  Eyes:     Conjunctiva/sclera: Conjunctivae normal.  Neck:     Thyroid : No thyroid  mass or thyromegaly.  Cardiovascular:     Rate and Rhythm: Normal rate and regular rhythm.     Pulses: Normal pulses.     Heart sounds: Normal heart sounds.  Pulmonary:     Effort: Pulmonary effort is normal.     Breath sounds: Normal breath sounds. No wheezing, rhonchi or rales.  Chest:  Breasts:    Breasts are symmetrical.     Right: No inverted nipple, mass, nipple discharge, skin change or tenderness.     Left: No inverted nipple, mass, nipple discharge, skin change or tenderness.  Abdominal:     General: Bowel sounds are normal. There is no distension.     Palpations: Abdomen is soft. Abdomen is not rigid. There is no fluid wave or mass.     Tenderness: There is no abdominal tenderness. There is no guarding or rebound.  Lymphadenopathy:     Head:     Right side of head: No submental, submandibular, tonsillar, preauricular, posterior auricular or occipital adenopathy.     Left side of head: No submental, submandibular, tonsillar, preauricular, posterior auricular or occipital adenopathy.     Cervical: No cervical adenopathy.     Right cervical: No superficial, deep or  posterior cervical adenopathy.    Left cervical: No superficial, deep or posterior cervical adenopathy.  Skin:    General: Skin is warm and dry.  Neurological:     Mental Status: She is alert.  Psychiatric:        Speech: Speech normal.        Behavior: Behavior normal.        Thought Content: Thought content normal.

## 2024-08-20 NOTE — Patient Instructions (Addendum)
 Call to make an appointment for Annual Lung cancer screen  With CT Chest:  [phone]. Let me know if any issues in doing so.  Health Maintenance for Postmenopausal Women Menopause is a normal process in which your ability to get pregnant comes to an end. This process happens slowly over many months or years, usually between the ages of 22 and 1. Menopause is complete when you have missed your menstrual period for 12 months. It is important to talk with your health care provider about some of the most common conditions that affect women after menopause (postmenopausal women). These include heart disease, cancer, and bone loss (osteoporosis). Adopting a healthy lifestyle and getting preventive care can help to promote your health and wellness. The actions you take can also lower your chances of developing some of these common conditions. What are the signs and symptoms of menopause? During menopause, you may have the following symptoms: Hot flashes. These can be moderate or severe. Night sweats. Decrease in sex drive. Mood swings. Headaches. Tiredness (fatigue). Irritability. Memory problems. Problems falling asleep or staying asleep. Talk with your health care provider about treatment options for your symptoms. Do I need hormone replacement therapy? Hormone replacement therapy is effective in treating symptoms that are caused by menopause, such as hot flashes and night sweats. Hormone replacement carries certain risks, especially as you become older. If you are thinking about using estrogen or estrogen with progestin, discuss the benefits and risks with your health care provider. How can I reduce my risk for heart disease and stroke? The risk of heart disease, heart attack, and stroke increases as you age. One of the causes may be a change in the body's hormones during menopause. This can affect how your body uses dietary fats, triglycerides, and cholesterol. Heart attack and stroke are  medical emergencies. There are many things that you can do to help prevent heart disease and stroke. Watch your blood pressure High blood pressure causes heart disease and increases the risk of stroke. This is more likely to develop in people who have high blood pressure readings or are overweight. Have your blood pressure checked: Every 3-5 years if you are 57-47 years of age. Every year if you are 17 years old or older. Eat a healthy diet  Eat a diet that includes plenty of vegetables, fruits, low-fat dairy products, and lean protein. Do not eat a lot of foods that are high in solid fats, added sugars, or sodium. Get regular exercise Get regular exercise. This is one of the most important things you can do for your health. Most adults should: Try to exercise for at least 150 minutes each week. The exercise should increase your heart rate and make you sweat (moderate-intensity exercise). Try to do strengthening exercises at least twice each week. Do these in addition to the moderate-intensity exercise. Spend less time sitting. Even light physical activity can be beneficial. Other tips Work with your health care provider to achieve or maintain a healthy weight. Do not use any products that contain nicotine or tobacco. These products include cigarettes, chewing tobacco, and vaping devices, such as e-cigarettes. If you need help quitting, ask your health care provider. Know your numbers. Ask your health care provider to check your cholesterol and your blood sugar (glucose). Continue to have your blood tested as directed by your health care provider. Do I need screening for cancer? Depending on your health history and family history, you may need to have cancer screenings at different stages of  your life. This may include screening for: Breast cancer. Cervical cancer. Lung cancer. Colorectal cancer. What is my risk for osteoporosis? After menopause, you may be at increased risk for  osteoporosis. Osteoporosis is a condition in which bone destruction happens more quickly than new bone creation. To help prevent osteoporosis or the bone fractures that can happen because of osteoporosis, you may take the following actions: If you are 18-48 years old, get at least 1,000 mg of calcium  and at least 600 international units (IU) of vitamin D  per day. If you are older than age 71 but younger than age 92, get at least 1,200 mg of calcium  and at least 600 international units (IU) of vitamin D  per day. If you are older than age 91, get at least 1,200 mg of calcium  and at least 800 international units (IU) of vitamin D  per day. Smoking and drinking excessive alcohol increase the risk of osteoporosis. Eat foods that are rich in calcium  and vitamin D , and do weight-bearing exercises several times each week as directed by your health care provider. How does menopause affect my mental health? Depression may occur at any age, but it is more common as you become older. Common symptoms of depression include: Feeling depressed. Changes in sleep patterns. Changes in appetite or eating patterns. Feeling an overall lack of motivation or enjoyment of activities that you previously enjoyed. Frequent crying spells. Talk with your health care provider if you think that you are experiencing any of these symptoms. General instructions See your health care provider for regular wellness exams and vaccines. This may include: Scheduling regular health, dental, and eye exams. Getting and maintaining your vaccines. These include: Influenza vaccine. Get this vaccine each year before the flu season begins. Pneumonia vaccine. Shingles vaccine. Tetanus, diphtheria, and pertussis (Tdap) booster vaccine. Your health care provider may also recommend other immunizations. Tell your health care provider if you have ever been abused or do not feel safe at home. Summary Menopause is a normal process in which your  ability to get pregnant comes to an end. This condition causes hot flashes, night sweats, decreased interest in sex, mood swings, headaches, or lack of sleep. Treatment for this condition may include hormone replacement therapy. Take actions to keep yourself healthy, including exercising regularly, eating a healthy diet, watching your weight, and checking your blood pressure and blood sugar levels. Get screened for cancer and depression. Make sure that you are up to date with all your vaccines. This information is not intended to replace advice given to you by your health care provider. Make sure you discuss any questions you have with your health care provider. Document Revised: 02/13/2021 Document Reviewed: 02/13/2021 Elsevier Patient Education  2024 [employer].

## 2024-08-20 NOTE — Assessment & Plan Note (Signed)
 Discussed trial of multiple statins since myalgias.  Also discussed CT calcium  score, of 0.  Discussed her seeing the family history of ASCVD and known aortic atherosclerosis.  she politely declines any further trial of statin or nonstatin therapy at this time

## 2024-08-21 ENCOUNTER — Ambulatory Visit: Payer: Self-pay | Admitting: Family

## 2024-08-21 DIAGNOSIS — E559 Vitamin D deficiency, unspecified: Secondary | ICD-10-CM

## 2024-08-21 LAB — T4: T4, Total: 7.8 ug/dL (ref 5.1–11.9)

## 2024-08-21 LAB — T3: T3, Total: 100 ng/dL (ref 76–181)

## 2024-08-21 MED ORDER — CHOLECALCIFEROL 1.25 MG (50000 UT) PO TABS: ORAL_TABLET | ORAL | 0 refills | Status: AC

## 2024-08-24 NOTE — Assessment & Plan Note (Addendum)
 Clinical breast exam performed today.  Influenza and Tdap given.  She will call to schedule CT lung cancer screening and is aware that it is overdue
